# Patient Record
Sex: Female | Born: 1955 | Race: Black or African American | Hispanic: No | Marital: Married | Smoking: Never smoker
Health system: Southern US, Community
[De-identification: ages and names within clinical notes are randomized; demographics above are authoritative.]

## PROBLEM LIST (undated history)

## (undated) DIAGNOSIS — Z1231 Encounter for screening mammogram for malignant neoplasm of breast: Secondary | ICD-10-CM

## (undated) DIAGNOSIS — Z01419 Encounter for gynecological examination (general) (routine) without abnormal findings: Secondary | ICD-10-CM

## (undated) DIAGNOSIS — D649 Anemia, unspecified: Secondary | ICD-10-CM

## (undated) DIAGNOSIS — K5792 Diverticulitis of intestine, part unspecified, without perforation or abscess without bleeding: Secondary | ICD-10-CM

## (undated) DIAGNOSIS — E559 Vitamin D deficiency, unspecified: Secondary | ICD-10-CM

## (undated) HISTORY — PX: COLONOSCOPY: SHX174

---

## 1979-12-14 HISTORY — PX: BREAST CYST ASPIRATION: SHX578

## 2011-08-25 NOTE — Progress Notes (Signed)
HISTORY OF PRESENT ILLNESS  SCARLETH BRAME is a 55 y.o. female.  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  Negative for fever, chills, weight loss, malaise/fatigue and diaphoresis.   HENT: Negative for nosebleeds, congestion, neck pain and tinnitus.    Eyes: Negative for blurred vision, double vision and photophobia.   Respiratory: Negative for cough, hemoptysis, sputum production, shortness of breath and wheezing.    Cardiovascular: Negative for chest pain, palpitations, orthopnea, claudication, leg swelling and PND.   Gastrointestinal: Negative for heartburn, nausea, vomiting, abdominal pain, diarrhea, constipation, blood in stool and melena.   Genitourinary: Negative for dysuria, urgency, frequency and hematuria.   Musculoskeletal: Negative for myalgias, back pain and joint pain.   Skin: Negative for itching and rash.   Neurological: Negative for dizziness, tingling, sensory change, speech change, focal weakness, weakness and headaches.   Endo/Heme/Allergies: Negative for polydipsia. Does not bruise/bleed easily.   Psychiatric/Behavioral: Negative for depression. The patient is not nervous/anxious and does not have insomnia.        Physical Exam   Nursing note and vitals reviewed.  Constitutional: She is oriented to person, place, and time. She appears well-developed and well-nourished.   HENT:   Head: Normocephalic and atraumatic.   Right Ear: External ear normal.   Left Ear: External ear normal.   Nose: Nose normal.   Mouth/Throat: Oropharynx is clear and moist.   Neck: Normal range of motion. Neck supple. No JVD present. Carotid bruit is not present. No mass and no thyromegaly present.    Cardiovascular: Normal rate, regular rhythm, S1 normal, S2 normal, normal heart sounds, intact distal pulses and normal pulses.  Exam reveals no gallop and no friction rub.    No murmur heard.  Pulmonary/Chest: Effort normal and breath sounds normal.   Abdominal: Soft. Bowel sounds are normal.   Musculoskeletal: Normal range of motion.   Neurological: She is alert and oriented to person, place, and time. She has normal strength.   Skin: Skin is warm and dry.   Psychiatric: She has a normal mood and affect. Her behavior is normal. Judgment and thought content normal.       ASSESSMENT and PLAN  Janeece was seen today for establish care.    Diagnoses and associated orders for this visit:    Uti (lower urinary tract infection)- this has resolved    Routine general medical examination at a health care facility    Visit for screening mammogram  - MAM MAMMO BI SCREENING DIGTL; Future    Lipid disorder- she takes some fish oil, flax seed and zinc    Anemia- this is better with iron        lab results and schedule of future lab studies reviewed with patient  reviewed diet, exercise and weight control  cardiovascular risk and specific lipid/LDL goals reviewed  reviewed medications and side effects in detail

## 2011-10-08 ENCOUNTER — Encounter

## 2011-10-13 NOTE — Progress Notes (Signed)
Quick Note:    Letter sent  ______

## 2012-03-29 ENCOUNTER — Other Ambulatory Visit

## 2012-03-29 LAB — AMB POC URINALYSIS DIP STICK AUTO W/O MICRO
Glucose (UA POC): NEGATIVE
Ketones (UA POC): NEGATIVE
Leukocyte esterase (UA POC): NEGATIVE
Nitrites (UA POC): NEGATIVE
Protein (UA POC): NEGATIVE mg/dL
Specific gravity (UA POC): 1.025 (ref 1.001–1.035)
pH (UA POC): 5.5 (ref 4.6–8.0)

## 2012-03-29 NOTE — Progress Notes (Signed)
Obtained appt for CT today at 1:30 arrival and 3:30 procedure.

## 2012-03-29 NOTE — Progress Notes (Signed)
HISTORY OF PRESENT ILLNESS  Melanie Hudson is a 56 y.o. female.  HPI  She had a pina on her left side and she went to [facility]- told her it was a UTI- started getting ready for the trip and had the pain in her side again- she had the pain in December and the pain came again in [place]- states that having a bowel movement does make it better - no change blood is stool- normal bowel movements and she does eat a lot of fiber     Review of Systems   Constitutional: Negative for fever, chills, weight loss, malaise/fatigue and diaphoresis.   HENT: Negative for hearing loss, ear pain, congestion, sore throat, neck pain and tinnitus.    Eyes: Negative for blurred vision and double vision.   Respiratory: Negative for cough, hemoptysis, sputum production, shortness of breath and wheezing.    Cardiovascular: Negative for chest pain, palpitations, orthopnea, claudication, leg swelling and PND.   Gastrointestinal: Negative for heartburn, nausea, vomiting, abdominal pain, diarrhea, constipation and blood in stool.   Genitourinary: Negative for dysuria, urgency, frequency, hematuria and flank pain.   Musculoskeletal: Negative for myalgias, back pain and joint pain.   Skin: Negative.    Neurological: Negative for dizziness, tingling, sensory change, speech change, focal weakness, seizures, loss of consciousness, weakness and headaches.   Endo/Heme/Allergies: Negative for environmental allergies and polydipsia. Does not bruise/bleed easily.   Psychiatric/Behavioral: Negative for depression, suicidal ideas, memory loss and substance abuse. The patient is not nervous/anxious and does not have insomnia.        Physical Exam   Nursing note and vitals reviewed.  Constitutional: She is oriented to person, place, and time. She appears well-developed and well-nourished.   HENT:   Head: Normocephalic and atraumatic.   Right Ear: External ear normal.   Left Ear: External ear normal.   Nose: Nose normal.   Mouth/Throat: Oropharynx is  clear and moist.   Eyes: Conjunctivae and EOM are normal. Pupils are equal, round, and reactive to light.   Neck: Normal range of motion. Neck supple.   Cardiovascular: Normal rate, regular rhythm, normal heart sounds and intact distal pulses.    Pulmonary/Chest: Effort normal and breath sounds normal. Right breast exhibits no inverted nipple, no mass, no nipple discharge, no skin change and no tenderness. Left breast exhibits no inverted nipple, no mass, no nipple discharge, no skin change and no tenderness. Breasts are symmetrical.   Abdominal: Soft. Bowel sounds are normal. There is tenderness.        Left lower abdominal pain   Genitourinary: Rectum normal and vagina normal. Rectal exam shows anal tone normal. Guaiac negative stool. No breast swelling, tenderness, discharge or bleeding.        No CMT some mild pressure on right  Adnexal check   Musculoskeletal: Normal range of motion.   Neurological: She is alert and oriented to person, place, and time.   Skin: Skin is warm and dry.   Psychiatric: She has a normal mood and affect. Her behavior is normal. Judgment and thought content normal.       ASSESSMENT and PLAN  Melanie Hudson was seen today for well woman.    Diagnoses and associated orders for this visit:    Routine general medical examination at a health care facility  - LIPID PANEL    Urinary pain  - AMB POC URINALYSIS DIP STICK AUTO W/O MICRO    Abdominal pain, unspecified site- ? Ovarian cyst or diverticulitis? Is  it something with the colon? SHe needs a CT scan and we need to set her up with a colonoscopy  - CT ABD W CONT; Future  - CT PELV W CONT; Future  - METABOLIC PANEL, COMPREHENSIVE  - CBC WITH AUTOMATED DIFF  - TSH, 3RD GENERATION        lab results and schedule of future lab studies reviewed with patient  reviewed diet, exercise and weight control  cardiovascular risk and specific lipid/LDL goals reviewed  reviewed medications and side effects in detail  Subjective:   57 y.o. female for annual  routine Pap and checkup.  She is postmenopausal.  Social History: single partner, contraception - post menopausal status.  Pertinent past medical hstory: no history of HTN, DVT, CAD, DM, liver disease, migraines or smoking.      ROS:  Feeling well. No dyspnea or chest pain on exertion.  No abdominal pain, change in bowel habits, black or bloody stools.  No urinary tract symptoms. GYN ROS: no breast pain or new or enlarging lumps on self exam, no vaginal bleeding, no discharge or pelvic pain. Menopausal symptoms: none. No neurological complaints.        Objective:   BP 136/68   Temp(Src) 98.1 ??F (36.7 ??C) (Oral)   Resp 14   Ht 5' 5.5" (1.664 m)   Wt 182 lb (82.555 kg)   BMI 29.83 kg/m2  The patient appears well, alert, oriented x 3, in no distress.  ENT normal.  Neck supple. No adenopathy or thyromegaly. PERLA. Lungs are clear, good air entry, no wheezes, rhonchi or rales. S1 and S2 normal, no murmurs, regular rate and rhythm. Abdomen soft without tenderness, guarding, mass or organomegaly. Extremities show no edema, normal peripheral pulses. Neurological is normal, no focal findings.    BREAST EXAM: breasts appear normal, no suspicious masses, no skin or nipple changes or axillary nodes    PELVIC EXAM: normal external genitalia, vulva, vagina, cervix, uterus and adnexa    Assessment/Plan:   well woman  postmenopausal  mammogram  pap smear  return annually or prn  screening colonoscopy referral written  Melanie Hudson was seen today for well woman.    Diagnoses and associated orders for this visit:    Routine general medical examination at a health care facility  - LIPID PANEL    Urinary pain  - AMB POC URINALYSIS DIP STICK AUTO W/O MICRO    Abdominal pain, unspecified site  - CT ABD W CONT; Future  - CT PELV W CONT; Future  - METABOLIC PANEL, COMPREHENSIVE  - CBC WITH AUTOMATED DIFF  - TSH, 3RD GENERATION        lab results and schedule of future lab studies reviewed with patient  reviewed diet, exercise and weight control   cardiovascular risk and specific lipid/LDL goals reviewed  reviewed medications and side effects in detail.

## 2012-03-29 NOTE — Progress Notes (Signed)
"  REVIEWED RECORD IN PREPARATION FOR VISIT AND HAVE OBTAINED NECESSARY DOCUMENTATION"

## 2012-03-29 NOTE — Addendum Note (Signed)
Addended by: Titus Dubin on: 03/29/2012 01:38 PM     Modules accepted: Orders

## 2012-03-29 NOTE — Addendum Note (Signed)
Addended by: Delynn Flavin on: 03/29/2012 04:38 PM     Modules accepted: Orders

## 2012-03-30 ENCOUNTER — Encounter

## 2012-03-30 LAB — CBC WITH AUTOMATED DIFF
ABS. BASOPHILS: 0 10*3/uL (ref 0.0–0.2)
ABS. EOSINOPHILS: 0.2 10*3/uL (ref 0.0–0.4)
ABS. IMM. GRANS.: 0 10*3/uL (ref 0.0–0.1)
ABS. MONOCYTES: 0.4 10*3/uL (ref 0.1–1.0)
ABS. NEUTROPHILS: 3 10*3/uL (ref 1.8–7.8)
Abs Lymphocytes: 1.7 10*3/uL (ref 0.7–4.5)
BASOPHILS: 0 % (ref 0–3)
EOSINOPHILS: 3 % (ref 0–7)
HCT: 34.9 % (ref 34.0–46.6)
HGB: 11.9 g/dL (ref 11.1–15.9)
IMMATURE GRANULOCYTES: 0 % (ref 0–2)
Lymphocytes: 32 % (ref 14–46)
MCH: 31.5 pg (ref 26.6–33.0)
MCHC: 34.1 g/dL (ref 31.5–35.7)
MCV: 92 fL (ref 79–97)
MONOCYTES: 7 % (ref 4–13)
NEUTROPHILS: 58 % (ref 40–74)
PLATELET: 206 10*3/uL (ref 140–415)
RBC: 3.78 x10E6/uL (ref 3.77–5.28)
RDW: 13.7 % (ref 12.3–15.4)
WBC: 5.3 10*3/uL (ref 4.0–10.5)

## 2012-03-30 LAB — METABOLIC PANEL, COMPREHENSIVE
A-G Ratio: 1.5 (ref 1.1–2.5)
ALT (SGPT): 26 IU/L (ref 0–32)
AST (SGOT): 23 IU/L (ref 0–40)
Albumin: 4.4 g/dL (ref 3.5–5.5)
Alk. phosphatase: 88 IU/L (ref 25–150)
BUN/Creatinine ratio: 19 (ref 9–23)
BUN: 15 mg/dL (ref 6–24)
Bilirubin, total: 0.3 mg/dL (ref 0.0–1.2)
CO2: 22 mmol/L (ref 20–32)
Calcium: 9.3 mg/dL (ref 8.7–10.2)
Chloride: 103 mmol/L (ref 97–108)
Creatinine: 0.79 mg/dL (ref 0.57–1.00)
GFR est non-AA: 83 mL/min/{1.73_m2} (ref >59)
GLOBULIN, TOTAL: 2.9 g/dL (ref 1.5–4.5)
Glucose: 83 mg/dL (ref 65–99)
Potassium: 4.2 mmol/L (ref 3.5–5.2)
Protein, total: 7.3 g/dL (ref 6.0–8.5)
Sodium: 141 mmol/L (ref 134–144)
eGFR If African American: 96 mL/min/{1.73_m2} (ref >59)

## 2012-03-30 LAB — LIPID PANEL
Cholesterol, total: 189 mg/dL (ref 100–199)
HDL Cholesterol: 52 mg/dL (ref >39)
LDL, calculated: 117 mg/dL — ABNORMAL HIGH (ref 0–99)
Triglyceride: 99 mg/dL (ref 0–149)
VLDL, calculated: 20 mg/dL (ref 5–40)

## 2012-03-30 LAB — TSH 3RD GENERATION: TSH: 1.58 u[IU]/mL (ref 0.450–4.500)

## 2012-03-30 MED ORDER — IOVERSOL 350 MG/ML IV SOLN
350 mg iodine/mL | Freq: Once | INTRAVENOUS | Status: AC
Start: 2012-03-30 — End: 2012-03-30
  Administered 2012-03-30: 16:00:00 via INTRAVENOUS

## 2012-03-30 MED ORDER — IOVERSOL 350 MG/ML IV SOLN
350 mg iodine/mL | Freq: Once | INTRAVENOUS | Status: AC
Start: 2012-03-30 — End: 2012-03-30

## 2012-03-30 MED ORDER — CIPROFLOXACIN 500 MG TAB
500 mg | ORAL_TABLET | Freq: Two times a day (BID) | ORAL | Status: AC
Start: 2012-03-30 — End: 2012-04-09

## 2012-03-30 MED ORDER — METRONIDAZOLE 500 MG TAB
500 mg | ORAL_TABLET | Freq: Two times a day (BID) | ORAL | Status: AC
Start: 2012-03-30 — End: 2012-04-09

## 2012-03-30 MED FILL — OPTIRAY 350 MG IODINE/ML INTRAVENOUS SOLUTION: 350 mg iodine/mL | INTRAVENOUS | Qty: 100

## 2012-03-30 NOTE — Progress Notes (Signed)
Quick Note:    Sent in antibiotics - follow up in a week  ______

## 2012-03-30 NOTE — Progress Notes (Signed)
Quick Note:    Letter sent  ______

## 2012-03-31 LAB — CULTURE, URINE

## 2012-04-12 NOTE — Progress Notes (Signed)
"  Reviewed record in preparation for visit and have obtained necessary documentation."

## 2012-04-12 NOTE — Progress Notes (Signed)
HISTORY OF PRESENT ILLNESS  Melanie Hudson is a 56 y.o. female.  HPI  Diverticulitis- she did feel better after she took the antibiotics- she knows that she is suppose to have fiber in her diet- she knows that it is the travel that makes it worse- she is trying to eat right and no longer has any abdominal pain  Review of Systems   Constitutional: Negative.  Negative for fever, chills, weight loss, malaise/fatigue and diaphoresis.   HENT: Negative for nosebleeds, congestion, neck pain and tinnitus.    Eyes: Negative for blurred vision, double vision and photophobia.   Respiratory: Negative for cough, hemoptysis, sputum production, shortness of breath and wheezing.    Cardiovascular: Negative for chest pain, palpitations, orthopnea, claudication, leg swelling and PND.   Gastrointestinal: Negative for heartburn, nausea, vomiting, abdominal pain, diarrhea, constipation, blood in stool and melena.   Genitourinary: Negative for dysuria, urgency, frequency and hematuria.   Musculoskeletal: Negative for myalgias, back pain and joint pain.   Skin: Negative for itching and rash.   Neurological: Negative for dizziness, tingling, sensory change, speech change, focal weakness, weakness and headaches.   Endo/Heme/Allergies: Negative for polydipsia. Does not bruise/bleed easily.   Psychiatric/Behavioral: Negative for depression. The patient is not nervous/anxious and does not have insomnia.        Physical Exam   Nursing note and vitals reviewed.  Constitutional: She is oriented to person, place, and time. She appears well-developed and well-nourished.   HENT:   Head: Normocephalic and atraumatic.   Right Ear: External ear normal.   Left Ear: External ear normal.   Nose: Nose normal.   Mouth/Throat: Oropharynx is clear and moist.   Neck: Normal range of motion. Neck supple. No JVD present. Carotid bruit is not present. No mass and no thyromegaly present.   Cardiovascular: Normal rate, regular rhythm, S1 normal, S2 normal,  normal heart sounds, intact distal pulses and normal pulses.  Exam reveals no gallop and no friction rub.    No murmur heard.  Pulmonary/Chest: Effort normal and breath sounds normal.   Abdominal: Soft. Bowel sounds are normal.   Musculoskeletal: Normal range of motion.   Neurological: She is alert and oriented to person, place, and time. She has normal strength.   Skin: Skin is warm and dry.   Psychiatric: She has a normal mood and affect. Her behavior is normal. Judgment and thought content normal.       ASSESSMENT and PLAN  Melanie Hudson was seen today for follow-up.    Diagnoses and associated orders for this visit:    Diverticulitis  Over 50% of the 25 minutes face to face with Melanie Hudson consisted of counseling and/or discussing treatment plans in reference to her abdominal pain and her bowel movements, diverticulitis.                lab results and schedule of future lab studies reviewed with patient  reviewed diet, exercise and weight control  cardiovascular risk and specific lipid/LDL goals reviewed  reviewed medications and side effects in detail

## 2012-09-13 LAB — AMB POC URINALYSIS DIP STICK AUTO W/O MICRO
Glucose (UA POC): NEGATIVE
Ketones (UA POC): NEGATIVE
Leukocyte esterase (UA POC): NEGATIVE
Nitrites (UA POC): POSITIVE
Protein (UA POC): NEGATIVE mg/dL
Specific gravity (UA POC): 1.025 (ref 1.001–1.035)
pH (UA POC): 6 (ref 4.6–8.0)

## 2012-09-13 NOTE — Progress Notes (Signed)
"  Reviewed record in preparation for visit and have obtained necessary documentation."

## 2012-09-13 NOTE — Progress Notes (Signed)
HISTORY OF PRESENT ILLNESS  Melanie Hudson is a 56 y.o. female.  HPI  Subjective:     Melanie Hudson is a 56 y.o. female who complains of dysuria, frequency, urgency, hematuria, hesitancy for 7 days.  Patient also complains of back pain. Patient denies fever.  Patient does have a history of recurrent UTI.  Patient does not have a history of pyelonephritis. She doe shave some back pain and feels like it is internal    Review of Systems   Constitutional: Negative.  Negative for fever, chills, weight loss, malaise/fatigue and diaphoresis.   HENT: Negative for nosebleeds, congestion, neck pain and tinnitus.    Eyes: Negative for blurred vision, double vision and photophobia.   Respiratory: Negative for cough, hemoptysis, sputum production, shortness of breath and wheezing.    Cardiovascular: Negative for chest pain, palpitations, orthopnea, claudication, leg swelling and PND.   Gastrointestinal: Negative for heartburn, nausea, vomiting, abdominal pain, diarrhea, constipation, blood in stool and melena.   Genitourinary: Positive for dysuria, urgency and frequency. Negative for hematuria.   Musculoskeletal: Negative for myalgias, back pain and joint pain.   Skin: Negative for itching and rash.   Neurological: Negative for dizziness, tingling, sensory change, speech change, focal weakness, weakness and headaches.   Endo/Heme/Allergies: Negative for polydipsia. Does not bruise/bleed easily.   Psychiatric/Behavioral: Negative for depression. The patient is not nervous/anxious and does not have insomnia.        Physical Exam   Nursing note and vitals reviewed.  Constitutional: She is oriented to person, place, and time. She appears well-developed and well-nourished.   HENT:   Head: Normocephalic and atraumatic.   Right Ear: External ear normal.   Left Ear: External ear normal.   Nose: Nose normal.   Mouth/Throat: Oropharynx is clear and moist.   Neck: Normal range of motion. Neck supple. No JVD present. Carotid  bruit is not present. No mass and no thyromegaly present.   Cardiovascular: Normal rate, regular rhythm, S1 normal, S2 normal, normal heart sounds, intact distal pulses and normal pulses.  Exam reveals no gallop and no friction rub.    No murmur heard.  Pulmonary/Chest: Effort normal and breath sounds normal.   Abdominal: Soft. Bowel sounds are normal.   Musculoskeletal: Normal range of motion.   Neurological: She is alert and oriented to person, place, and time. She has normal strength.   Skin: Skin is warm and dry.   Psychiatric: She has a normal mood and affect. Her behavior is normal. Judgment and thought content normal.       ASSESSMENT and PLAN  Melanie Hudson was seen today for back pain and urinary frequency.    Diagnoses and associated orders for this visit:    Frequency of urination  - AMB POC URINALYSIS DIP STICK AUTO W/O MICRO  - CULTURE, URINE    Back pain-resolving and doing better    Other Orders  - ciprofloxacin (CIPRO) 500 mg tablet; Take 1 Tab by mouth two (2) times a day for 7 days.        lab results and schedule of future lab studies reviewed with patient  reviewed diet, exercise and weight control  cardiovascular risk and specific lipid/LDL goals reviewed  reviewed medications and side effects in detail

## 2012-09-15 LAB — CULTURE, URINE

## 2012-09-18 NOTE — Telephone Encounter (Signed)
Message copied by Littie Deeds on Mon Sep 18, 2012 10:09 AM  ------       Message from: [place], [place]ON[place]       Created: Mon Sep 18, 2012  6:58 AM         Please call she needs to be on different antibiotic- I sent in bactrim for her and she needs to stop the cipro as that antibiotic will not work for her urine

## 2012-09-18 NOTE — Telephone Encounter (Signed)
Called pt and left a v/m for pt to stop taking Cipro per PCP and start taking Bactrim and it was sent to her local pharmacy.

## 2012-09-18 NOTE — Progress Notes (Signed)
Quick Note:    Please call she needs to be on different antibiotic- I sent in bactrim for her and she needs to stop the cipro as that antibiotic will not work for her urine  ______

## 2012-09-22 NOTE — Telephone Encounter (Signed)
Patient return my phone call from 09/18/2012 stating that she just check her phone and got my message stating that she needs to stop taking the Cipro and start taking Bactrim. Pt stated that she completed the Cipro and will start taking the Bactrim today. Pt denies any nausea, vomiting or diarrhea. Advised pt to make a follow up appt with PCP so that she can get her urine tested again to see if she is still having bacteria in her urine. Pt understood and will make a follow up appt with PCP.

## 2012-10-17 ENCOUNTER — Encounter

## 2012-10-25 LAB — AMB POC URINALYSIS DIP STICK AUTO W/O MICRO
Glucose (UA POC): NEGATIVE
Leukocyte esterase (UA POC): NEGATIVE
Nitrites (UA POC): NEGATIVE
Protein (UA POC): NEGATIVE mg/dL
Specific gravity (UA POC): 1.02 (ref 1.001–1.035)
pH (UA POC): 7.5 (ref 4.6–8.0)

## 2012-10-25 NOTE — Progress Notes (Signed)
"  Reviewed record in preparation for visit and have obtained necessary documentation."

## 2012-10-25 NOTE — Progress Notes (Signed)
HISTORY OF PRESENT ILLNESS  Melanie Hudson is a 56 y.o. female.  HPI  Has had back pain with some blood in urine and here for follow up- urgency and frequency is all better now and feels better   She is doing her treadmill and walks for an hour  Review of Systems   Constitutional: Negative.  Negative for fever, chills, weight loss, malaise/fatigue and diaphoresis.   HENT: Negative for nosebleeds, congestion, neck pain and tinnitus.    Eyes: Negative for blurred vision, double vision and photophobia.   Respiratory: Negative for cough, hemoptysis, sputum production, shortness of breath and wheezing.    Cardiovascular: Negative for chest pain, palpitations, orthopnea, claudication, leg swelling and PND.   Gastrointestinal: Negative for heartburn, nausea, vomiting, abdominal pain, diarrhea, constipation, blood in stool and melena.   Genitourinary: Negative for dysuria, urgency, frequency and hematuria.   Musculoskeletal: Negative for myalgias, back pain and joint pain.   Skin: Negative for itching and rash.   Neurological: Negative for dizziness, tingling, sensory change, speech change, focal weakness, weakness and headaches.   Endo/Heme/Allergies: Negative for polydipsia. Does not bruise/bleed easily.   Psychiatric/Behavioral: Negative for depression. The patient is not nervous/anxious and does not have insomnia.        Physical Exam   Nursing note and vitals reviewed.  Constitutional: She is oriented to person, place, and time. She appears well-developed and well-nourished.   HENT:   Head: Normocephalic and atraumatic.   Right Ear: External ear normal.   Left Ear: External ear normal.   Nose: Nose normal.   Mouth/Throat: Oropharynx is clear and moist.   Neck: Normal range of motion. Neck supple. No JVD present. Carotid bruit is not present. No mass and no thyromegaly present.   Cardiovascular: Normal rate, regular rhythm, S1 normal, S2 normal, normal heart sounds, intact distal pulses and normal pulses.  Exam  reveals no gallop and no friction rub.    No murmur heard.  Pulmonary/Chest: Effort normal and breath sounds normal.   Abdominal: Soft. Bowel sounds are normal.   Musculoskeletal: Normal range of motion.   Neurological: She is alert and oriented to person, place, and time. She has normal strength.   Skin: Skin is warm and dry.   Psychiatric: She has a normal mood and affect. Her behavior is normal. Judgment and thought content normal.       ASSESSMENT and PLAN  Bradi was seen today for follow-up.    Diagnoses and associated orders for this visit:    Back pain- urine looks better but will send off for culture - but will check [place] given back pain and hematuria  - [place] ABD COMP; Future    Screening for colon cancer  - REFERRAL TO GASTROENTEROLOGY        lab results and schedule of future lab studies reviewed with patient  reviewed diet, exercise and weight control  cardiovascular risk and specific lipid/LDL goals reviewed  reviewed medications and side effects in detail

## 2012-10-25 NOTE — Addendum Note (Signed)
Addended by: Eugene Garnet A on: 10/25/2012 03:38 PM     Modules accepted: Orders

## 2012-10-27 LAB — CULTURE, URINE
Urine Culture, Routine: NO GROWTH
Urine Culture, Routine: NO GROWTH

## 2012-10-31 NOTE — Progress Notes (Signed)
Quick Note:    Ultrasound normal  ______

## 2012-11-01 NOTE — Telephone Encounter (Signed)
Message copied by Brent General on Wed Nov 01, 2012  1:42 PM  ------       Message from: [facility], [place]       Created: Tue Oct 31, 2012  3:22 PM         Ultrasound normal  ------

## 2012-11-02 NOTE — Telephone Encounter (Signed)
Advised pt of [place] results. Pt has scheduled her colonoscopy for December.

## 2013-03-28 NOTE — Telephone Encounter (Signed)
Message copied by Hazeline Junker on Wed Mar 28, 2013  8:31 AM  ------       Message from: GAMBLE, Geoffry Paradise       Created: Tue Mar 27, 2013  5:31 PM       Regarding: Sherryll Burger / Telephone         [phone]              Pt is faxing an employment health form to the office and wants to know if it can be completed and returned to her.   ------

## 2013-04-17 LAB — AMB POC URINALYSIS DIP STICK AUTO W/O MICRO
Glucose (UA POC): NEGATIVE
Ketones (UA POC): NEGATIVE
Leukocyte esterase (UA POC): NEGATIVE
Nitrites (UA POC): POSITIVE
Protein (UA POC): NEGATIVE mg/dL
Specific gravity (UA POC): 1.025 (ref 1.001–1.035)
pH (UA POC): 6 (ref 4.6–8.0)

## 2013-04-17 NOTE — Patient Instructions (Signed)
Elevated Blood Pressure: After Your Visit  Your Care Instructions  Blood pressure is a measure of how hard the blood pushes against the walls of your arteries. It's normal for blood pressure to go up and down throughout the day. But if it stays up over time, you have high blood pressure.  Two numbers tell you your blood pressure. The first number is the systolic pressure. It shows how hard the blood pushes when your heart is pumping. The second number is the diastolic pressure. It shows how hard the blood pushes between heartbeats, when your heart is relaxed and filling with blood. A normal blood pressure in adults is less than 120/80 (say "120 over 80"). High blood pressure is 140/90 or higher.  The main test for high blood pressure is simple, fast, and painless. To diagnose high blood pressure, your doctor will test your blood pressure at different times. You may have to check your blood pressure at home if there is reason to think that the results in the doctor's office aren't accurate.  If you are diagnosed with high blood pressure, you can work with your doctor to make a long-term plan to manage it.  Follow-up care is a key part of your treatment and safety. Be sure to make and go to all appointments, and call your doctor if you are having problems. It's also a good idea to know your test results and keep a list of the medicines you take.  How can you care for yourself at home?  ?? Do not smoke. Smoking increases your risk for heart attack and stroke. If you need help quitting, talk to your doctor about stop-smoking programs and medicines. These can increase your chances of quitting for good.  ?? Stay at a healthy weight.  ?? Limit sodium.  ?? Be physically active. Get at least 30 minutes of exercise on most days of the week. Walking is a good choice. You also may want to do other activities, such as running, swimming, cycling, or playing tennis or team sports.  ?? Avoid or limit alcohol. Talk to your doctor about  whether you can drink any alcohol.  ?? Eat plenty of fruits, vegetables, and low-fat dairy products. Eat less saturated and total fats.  ?? Learn how to check your blood pressure at home.  When should you call for help?  Call your doctor now or seek immediate medical care if:  ?? Your blood pressure is much higher than normal (such as 180/110 or higher).  ?? You think high blood pressure is causing symptoms such as:  ?? Severe headache.  ?? Blurry vision.  ?? Nausea or vomiting.  Watch closely for changes in your health, and be sure to contact your doctor if:  ?? You do not get better as expected.   Where can you learn more?   Go to http://www.healthwise.net/[facility]  Enter F644 in the search box to learn more about "Elevated Blood Pressure: After Your Visit."   ?? 2006-2014 [employer]. Care instructions adapted under license by [employer] (which disclaims liability or warranty for this information). This care instruction is for use with your licensed healthcare professional. If you have questions about a medical condition or this instruction, always ask your healthcare professional. Healthwise, Incorporated disclaims any warranty or liability for your use of this information.  Content Version: 10.0.270728; Last Revised: April 07, 2011              High Blood Pressure: After Your Visit  Your   Care Instructions  If your blood pressure is usually above 140/90, you have high blood pressure, or hypertension. Despite what a lot of people think, high blood pressure usually doesn't cause headaches or make you feel dizzy or lightheaded. It usually has no symptoms. But it does increase your risk for heart attack, stroke, and kidney or eye damage. The higher your blood pressure, the more your risk increases.  Your doctor will give you a goal for your blood pressure. This goal may be below 140/90. Or it may be even lower if you have other health problems, such as diabetes, heart failure, or coronary artery disease.   Changes in your lifestyle, such as staying at a healthy weight, may help you lower your blood pressure. Your treatment also will include medicine. If you stop taking your medicine, your blood pressure will go back up.  Follow-up care is a key part of your treatment and safety. Be sure to make and go to all appointments, and call your doctor if you are having problems. It???s also a good idea to know your test results and keep a list of the medicines you take.  How can you care for yourself at home?  Medical treatment  ?? Take your medicine exactly as prescribed. You may take one or more types of medicine to lower your blood pressure. They include diuretics, beta-blockers, ACE inhibitors, calcium channel blockers, angiotensin II receptor blockers, and other medicines. Call your doctor if you think you are having a problem with your medicine.  ?? Your doctor may suggest that you take one low-dose aspirin (81 mg) a day. This can help reduce your risk of having a stroke or heart attack.  ?? See your doctor at least 2 times a year. You may need to see the doctor more often at first or until your blood pressure comes down.  ?? If you are taking blood pressure medicine, talk to your doctor before you take decongestants or anti-inflammatory medicine, such as ibuprofen. Some of these medicines can raise blood pressure.  ?? Learn how to check your blood pressure at home.  Lifestyle changes  ?? Stay at a healthy weight. This is especially important if you put on weight around the waist. Losing even 10 pounds can help you lower your blood pressure.  ?? If your doctor recommends it, get more exercise. Walking is a good choice. Bit by bit, increase the amount you walk every day. Try for at least 30 minutes on most days of the week. You also may want to swim, bike, or do other activities.  ?? Avoid or limit alcohol. Talk to your doctor about whether you can drink any alcohol.  ?? Limit salt.  ?? Eat plenty of fruits (such as bananas and  oranges), vegetables, legumes, whole grains, and low-fat dairy products.  ?? Lower the amount of saturated fat in your diet. Saturated fat is found in animal products such as milk, cheese, and meat. Limiting these foods may help you lose weight and also lower your risk for heart disease.  ?? Do not smoke. Smoking increases your risk for heart attack and stroke. If you need help quitting, talk to your doctor about stop-smoking programs and medicines. These can increase your chances of quitting for good.  When should you call for help?  Call your doctor now or seek immediate medical care if:  ?? Your blood pressure is much higher than normal (such as 180/110 or higher).  ?? You think high blood pressure is   causing symptoms such as:  ?? Severe headache.  ?? Blurry vision.  ?? Nausea or vomiting.  Watch closely for changes in your health, and be sure to contact your doctor if:  ?? You do not get better as expected.   Where can you learn more?   Go to http://www.healthwise.net/[facility]  Enter X567 in the search box to learn more about "High Blood Pressure: After Your Visit."   ?? 2006-2014 Healthwise, Inc[employer]. Care instructions adapted under license by [employer] (which disclaims liability or warranty for this information). This care instruction is for use with your licensed healthcare professional. If you have questions about a medical condition or this instruction, always ask your healthcare professional. Healthwise, Incorporated disclaims any warranty or liability for your use of this information.  Content Version: 10.0.270728; Last Revised: February 01, 2012

## 2013-04-17 NOTE — Progress Notes (Signed)
Subjective:     Chief Complaint   Patient presents with   ??? Urinary Burning   ??? Urinary Frequency     Melanie Hudson is a 57 y.o. female who complains of dysuria, frequency, urgency for 1 month. Patient denies flank pain, vomiting, fever, unusual vaginal discharge. Patient does have a history of recurrent UTI.  Patient does not have a history of pyelonephritis.    Blood pressure without hypertension  No cp/sob  She does not check her bp at home but has bp cuff    Current Outpatient Prescriptions   Medication Sig Dispense Refill   ??? trimethoprim-sulfamethoxazole (BACTRIM DS, SEPTRA DS) 160-800 mg per tablet Take 1 Tab by mouth two (2) times a day for 10 days.  20 Tab  0     Past Medical History   Diagnosis Date   ??? Anemia 08/25/2011     Past Surgical History   Procedure Laterality Date   ??? Endoscopy, colon, diagnostic       2005 normal        Review of Systems  Genitourinary:positive for frequency, dysuria and hematuria    Objective:     BP 166/88   Pulse 67   Temp(Src) 98.1 ??F (36.7 ??C) (Oral)   Resp 20   Ht 5' 5.5" (1.664 m)   Wt 186 lb (84.369 kg)   BMI 30.47 kg/m2   SpO2 98%  General Appearance:  Well developed, well nourished,alert and oriented x 3, and individual in no acute distress.   Ears/Nose/Mouth/Throat:   Hearing grossly normal.         Neck: Supple.   Chest:   Lungs clear to auscultation bilaterally.   Cardiovascular:  Regular rate and rhythm, S1, S2 normal, no murmur.   Abdomen:   Soft, non-tender, bowel sounds are active.   Extremities: No edema bilaterally.    Skin: Warm and dry.               Assessment/Plan:     Leetta was seen today for urinary burning and urinary frequency.    Diagnoses and associated orders for this visit:    UTI (lower urinary tract infection)  Hx of resistence to cipro  - trimethoprim-sulfamethoxazole (BACTRIM DS, SEPTRA DS) 160-800 mg per tablet; Take 1 Tab by mouth two (2) times a day for 10 days.  ucx pending  - AMB POC URINALYSIS DIP STICK AUTO W/O MICRO  - CULTURE,  URINE  - URINALYSIS W/ RFLX MICROSCOPIC    Blood pressure elevated without history of HTN  Reviewed prior bp with pt in [facility]  She will check bp readings at home and if elevated will rtc   Info given re: dash diet

## 2013-04-19 LAB — CULTURE, URINE

## 2013-04-19 LAB — URINALYSIS W/ RFLX MICROSCOPIC
Bilirubin: NEGATIVE
Glucose: NEGATIVE
Ketone: NEGATIVE
Leukocyte Esterase: NEGATIVE
Nitrites: POSITIVE — AB
Protein: NEGATIVE
Specific Gravity: 1.024 (ref 1.005–1.030)
Urobilinogen: 1 mg/dL (ref 0.0–1.9)
pH (UA): 6 (ref 5.0–7.5)

## 2013-04-19 LAB — MICROSCOPIC EXAMINATION

## 2013-04-25 NOTE — Telephone Encounter (Signed)
Patient called and wanted to know the status of her [facility] Staff Health Report that was to be filled out and faxed back to [phone]. The contact number is [phone].

## 2013-04-26 NOTE — Telephone Encounter (Signed)
Made copy and faxed today, will mail back to her.

## 2013-04-26 NOTE — Telephone Encounter (Signed)
Called patient, she sent it via fax. She will try to resend, we have not received the form.

## 2013-05-01 LAB — AMB POC URINALYSIS DIP STICK AUTO W/O MICRO
Glucose (UA POC): NEGATIVE
Ketones (UA POC): NEGATIVE
Leukocyte esterase (UA POC): NEGATIVE
Nitrites (UA POC): NEGATIVE
Protein (UA POC): NEGATIVE mg/dL
Specific gravity (UA POC): 1.02 (ref 1.001–1.035)
pH (UA POC): 7 (ref 4.6–8.0)

## 2013-05-01 NOTE — Progress Notes (Signed)
"  Reviewed record in preparation for visit and have obtained necessary documentation."

## 2013-05-01 NOTE — Progress Notes (Signed)
HISTORY OF PRESENT ILLNESS  Melanie Hudson is a 57 y.o. female.  HPI  Here for follow up with recurrent uti and has trace blood in her urine- no fever or chills or back pain  Subjective:   Melanie Hudson is a 57 y.o. female with hypertension.    Hypertension ROS: not taking medications regularly as instructed, no TIA's, no chest pain on exertion, no dyspnea on exertion, no swelling of ankles.   New concerns: it was high las ttime and so she is back here for a recheck.       Review of Systems   Constitutional: Negative.  Negative for fever, chills, weight loss, malaise/fatigue and diaphoresis.   HENT: Negative for nosebleeds, congestion, neck pain and tinnitus.    Eyes: Negative for blurred vision, double vision and photophobia.   Respiratory: Negative for cough, hemoptysis, sputum production, shortness of breath and wheezing.    Cardiovascular: Negative for chest pain, palpitations, orthopnea, claudication, leg swelling and PND.   Gastrointestinal: Negative for heartburn, nausea, vomiting, abdominal pain, diarrhea, constipation, blood in stool and melena.   Genitourinary: Negative for dysuria, urgency, frequency and hematuria.   Musculoskeletal: Negative for myalgias, back pain and joint pain.   Skin: Negative for itching and rash.   Neurological: Negative for dizziness, tingling, sensory change, speech change, focal weakness, weakness and headaches.   Endo/Heme/Allergies: Negative for polydipsia. Does not bruise/bleed easily.   Psychiatric/Behavioral: Negative for depression. The patient is not nervous/anxious and does not have insomnia.        Physical Exam   Nursing note and vitals reviewed.  Constitutional: She is oriented to person, place, and time. She appears well-developed and well-nourished.   HENT:   Head: Normocephalic and atraumatic.   Right Ear: External ear normal.   Left Ear: External ear normal.   Nose: Nose normal.   Mouth/Throat: Oropharynx is clear and moist.   Neck: Normal range of  motion. Neck supple. No JVD present. Carotid bruit is not present. No mass and no thyromegaly present.   Cardiovascular: Normal rate, regular rhythm, S1 normal, S2 normal, normal heart sounds, intact distal pulses and normal pulses.  Exam reveals no gallop and no friction rub.    No murmur heard.  Pulmonary/Chest: Effort normal and breath sounds normal.   Abdominal: Soft. Bowel sounds are normal.   Musculoskeletal: Normal range of motion.   Neurological: She is alert and oriented to person, place, and time. She has normal strength.   Skin: Skin is warm and dry.   Psychiatric: She has a normal mood and affect. Her behavior is normal. Judgment and thought content normal.       ASSESSMENT and PLAN  Rajean was seen today for blood pressure check and follow-up.    Diagnoses and associated orders for this visit:    Frequency of urination  - AMB POC URINALYSIS DIP STICK AUTO W/O MICRO  - REFERRAL TO UROLOGY    Elevated blood pressure reading without diagnosis of hypertension  This is much better and it was high and she was not feeling well and nervous but better        lab results and schedule of future lab studies reviewed with patient  reviewed diet, exercise and weight control  cardiovascular risk and specific lipid/LDL goals reviewed  reviewed medications and side effects in detail

## 2013-05-01 NOTE — Addendum Note (Signed)
Addended by: Eugene Garnet A on: 05/01/2013 03:49 PM     Modules accepted: Orders

## 2013-05-03 LAB — CULTURE, URINE

## 2013-05-14 NOTE — Progress Notes (Signed)
HISTORY OF PRESENT ILLNESS  Melanie Hudson is a 57 y.o. female.  HPI  States that she is having some right sided back pain and thought something was " acting up"; worred about her bowels- no burning when she urinates, her bowels are normal- she did take Nsaids last week and that did help  She had a big cookout on Memorial day with 30 people and started shortly after that and now she feels a little and not as bad as Friday-     Review of Systems   Constitutional: Negative.  Negative for fever, chills, weight loss, malaise/fatigue and diaphoresis.   HENT: Negative for nosebleeds, congestion, neck pain and tinnitus.    Eyes: Negative for blurred vision, double vision and photophobia.   Respiratory: Negative for cough, hemoptysis, sputum production, shortness of breath and wheezing.    Cardiovascular: Negative for chest pain, palpitations, orthopnea, claudication, leg swelling and PND.   Gastrointestinal: Negative for heartburn, nausea, vomiting, abdominal pain, diarrhea, constipation, blood in stool and melena.   Genitourinary: Negative for dysuria, urgency, frequency and hematuria.   Musculoskeletal: Positive for back pain. Negative for myalgias and joint pain.   Skin: Negative for itching and rash.   Neurological: Negative for dizziness, tingling, sensory change, speech change, focal weakness, weakness and headaches.   Endo/Heme/Allergies: Negative for polydipsia. Does not bruise/bleed easily.   Psychiatric/Behavioral: Negative for depression. The patient is not nervous/anxious and does not have insomnia.        Physical Exam   Nursing note and vitals reviewed.  Constitutional: She is oriented to person, place, and time. She appears well-developed and well-nourished.   HENT:   Head: Normocephalic and atraumatic.   Right Ear: External ear normal.   Left Ear: External ear normal.   Nose: Nose normal.   Mouth/Throat: Oropharynx is clear and moist.   Neck: Normal range of motion. Neck supple. No JVD present. Carotid  bruit is not present. No mass and no thyromegaly present.   Cardiovascular: Normal rate, regular rhythm, S1 normal, S2 normal, normal heart sounds, intact distal pulses and normal pulses.  Exam reveals no gallop and no friction rub.    No murmur heard.  Pulmonary/Chest: Effort normal and breath sounds normal.   Abdominal: Soft. Bowel sounds are normal.   Musculoskeletal: Normal range of motion.   Neurological: She is alert and oriented to person, place, and time. She has normal strength.   Skin: Skin is warm and dry.   Psychiatric: She has a normal mood and affect. Her behavior is normal. Judgment and thought content normal.       ASSESSMENT and PLAN  [name] was seen today for back pain.    Diagnoses and associated orders for this visit:    Back pain- I think this is more muscular than anything else- we had an [place] in 10/2012 that was normal- keep an eye on it - colon in June and has urology appt        lab results and schedule of future lab studies reviewed with patient  reviewed diet, exercise and weight control  cardiovascular risk and specific lipid/LDL goals reviewed  reviewed medications and side effects in detail

## 2013-05-14 NOTE — Progress Notes (Signed)
"  Reviewed record in preparation for visit and have obtained necessary documentation."

## 2013-11-05 ENCOUNTER — Encounter

## 2013-11-20 LAB — AMB POC URINALYSIS DIP STICK AUTO W/O MICRO
Blood (UA POC): NEGATIVE
Glucose (UA POC): NEGATIVE
Ketones (UA POC): NEGATIVE
Nitrites (UA POC): NEGATIVE
Protein (UA POC): NEGATIVE mg/dL
Specific gravity (UA POC): 1.015 (ref 1.001–1.035)
pH (UA POC): 8 (ref 4.6–8.0)

## 2013-11-20 NOTE — Patient Instructions (Addendum)
Vaginitis: After Your Visit  Your Care Instructions  Vaginitis is soreness or infection of the vagina. This common problem can cause itching and burning. And it can cause a change in vaginal discharge. Sometimes it can cause pain during sex. Vaginitis may be caused by bacteria, yeast, or other germs. Some infections that cause it are caught from a sexual partner. Bath products, spermicides, and douches can irritate the vagina too.  Some women have this problem during and after menopause. A drop in estrogen levels during this time can cause dryness, soreness, and pain during sex.  Your doctor can give you medicine to treat an infection. And home care may help you feel better. For certain types of infections, your sex partner must be treated too.  Follow-up care is a key part of your treatment and safety. Be sure to make and go to all appointments, and call your doctor if you are having problems. It's also a good idea to know your test results and keep a list of the medicines you take.  How can you care for yourself at home?  ?? If your doctor prescribed antibiotics, take them as directed. Do not stop taking them just because you feel better. You need to take the full course of antibiotics.  ?? Take your medicines exactly as prescribed. Call your doctor if you think you are having a problem with your medicine.  ?? Do not eat or drink anything that has alcohol if you are taking metronidazole (Flagyl).  ?? If you have a yeast infection, use over-the-counter products as your doctor tells you to. Or take medicine your doctor prescribes exactly as directed.  ?? Wash your vaginal area daily with water. You also can use a mild, unscented soap if you want.  ?? Do not use scented bath products. And do not use vaginal sprays or douches.  ?? Put a washcloth soaked in cool water on the area to relieve itching. Or you can take cool baths.  ?? If you have dryness because of menopause, use estrogen cream or pills that your doctor  prescribes.  ?? Ask your doctor about when it is okay to have sex.  ?? Use a personal lubricant before sex if you have dryness. Examples are Astroglide, K-Y Jelly, and Wet Lubricant Gel.  ?? Ask your doctor if your sex partner also needs treatment.  When should you call for help?  Call your doctor now or seek immediate medical care if:  ?? You have a fever and pelvic pain.  Watch closely for changes in your health, and be sure to contact your doctor if:  ?? You have bleeding other than your period.  ?? You do not get better as expected.   Where can you learn more?   Go to [facility]  Enter [phone] in the search box to learn more about "Vaginitis: After Your Visit."   ?? 2006-2014 [employer]. Care instructions adapted under license by [employer] (which disclaims liability or warranty for this information). This care instruction is for use with your licensed healthcare professional. If you have questions about a medical condition or this instruction, always ask your healthcare professional. Health[employer], Incorporated disclaims any warranty or liability for your use of this information.  Content Version: 10.2.346038; Current as of: February 21, 2013              Well Visit, Women 50 to 81: After Your Visit  Your Care Instructions  Physical exams can help you stay healthy. Your doctor  has checked your overall health and may have suggested ways to take good care of yourself. He or she also may have recommended tests. At home, you can help prevent illness with healthy eating, regular exercise, and other steps.  Follow-up care is a key part of your treatment and safety. Be sure to make and go to all appointments, and call your doctor if you are having problems. It's also a good idea to know your test results and keep a list of the medicines you take.  How can you care for yourself at home?  ?? Reach and stay at a healthy weight. This will lower your risk for many problems, such as obesity,  diabetes, heart disease, and high blood pressure.  ?? Get at least 30 minutes of exercise on most days of the week. Walking is a good choice. You also may want to do other activities, such as running, swimming, cycling, or playing tennis or team sports.  ?? Do not smoke. Smoking can make health problems worse. If you need help quitting, talk to your doctor about stop-smoking programs and medicines. These can increase your chances of quitting for good.  ?? Always wear sunscreen on exposed skin. Make sure the sunscreen blocks ultraviolet rays (both UVA and UVB) and has a sun protection factor (SPF) of at least 15. Use it every day, even when it is cloudy. Some doctors may recommend a higher SPF, such as 30.  ?? See a dentist one or two times a year for checkups and to have your teeth cleaned.  ?? Wear a seat belt in the car.  ?? Limit alcohol to 1 drink a day. Too much alcohol can cause health problems.  Follow your doctor's advice about when to have certain tests. These tests can spot problems early.  ?? Cholesterol. Your doctor will tell you how often to have this done based on your age, family history, or other things that can increase your risk for heart attack and stroke.  ?? Blood pressure. You will likely have your blood pressure checked at any visit to your doctor. If you are healthy and have a blood pressure below 120/80 mm Hg, have your blood pressure checked at least every 1 to 2 years. This can be done during a routine doctor visit. If you have slightly higher or high blood pressure, or are at risk for heart disease, your doctor will suggest more frequent tests.  ?? Mammogram. Ask your doctor how often you should have a mammogram, which is an X-ray of your breasts. A mammogram can spot breast cancer before it can be felt and when it is easiest to treat.  ?? Pap test and pelvic exam. Ask your doctor how often you should have a Pap test. You may not need to have a Pap test as often as you used to.  ?? Vision. Have your  eyes checked every year or two or as often as your doctor suggests. Some experts recommend that you have yearly exams for glaucoma and other age-related eye problems starting at age 43.  ?? Hearing. Tell your doctor if you notice any change in your hearing. You can have tests to find out how well you hear.  ?? Diabetes. Ask your doctor whether you should have tests for diabetes.  ?? Colon cancer. You should begin tests for colon cancer at age 13. You may have one of several tests. Your doctor will tell you how often to have tests based on your age and risk.  Risks include whether you already had a precancerous polyp removed from your colon or whether your parents, sisters and brothers, or children have had colon cancer.  ?? Thyroid disease. Talk to your doctor about whether to have your thyroid checked as part of a regular physical exam. Women have an increased chance of a thyroid problem.  ?? Osteoporosis. You should begin tests for bone density at age 55. If you are younger than 61, ask your doctor whether you have factors that may increase your risk for this disease. You may want to have this test before age 51.  ?? Heart attack and stroke risk. At least every 4 to 6 years, you should have your risk for heart attack and stroke assessed. Your doctor uses factors such as your age, blood pressure, cholesterol, and whether you smoke or have diabetes to show what your risk for a heart attack or stroke is over the next 10 years.  When should you call for help?  Watch closely for changes in your health, and be sure to contact your doctor if you have any problems or symptoms that concern you.   Where can you learn more?   Go to [facility]  Enter [phone] in the search box to learn more about "Well Visit, Women 50 to 65: After Your Visit."   ?? 2006-2014 [employer], [employer]. Care instructions adapted under license by [employer] (which disclaims liability or warranty for this information). This care  instruction is for use with your licensed healthcare professional. If you have questions about a medical condition or this instruction, always ask your healthcare professional. Health[employer], Incorporated disclaims any warranty or liability for your use of this information.  Content Version: 10.2.346038; Current as of: April 05, 2013

## 2013-11-20 NOTE — Progress Notes (Signed)
Subjective:   57 y.o. female for annual routine Pap and checkup. No abnormal pap smears in the past.  Last pap 03/2012; requesting pap today.  No LMP recorded. Patient is postmenopausal.    Social History: single partner, contraception - post menopausal status.  Pertinent past medical hstory: no history of HTN, DVT, CAD, DM, liver disease, migraines or smoking.    Current Outpatient Prescriptions   Medication Sig Dispense Refill   ??? omega-3 fatty acids-vitamin e (FISH OIL) 1,000 mg cap Take 1 capsule by mouth.       ??? Biotin 2,500 mcg cap Take  by mouth.       ??? trimethoprim-sulfamethoxazole (BACTRIM DS, SEPTRA DS) 160-800 mg per tablet Take 1 tablet by mouth two (2) times a day.  10 tablet  0     No Known Allergies  Past Medical History   Diagnosis Date   ??? Anemia 08/25/2011     Past Surgical History   Procedure Laterality Date   ??? Endoscopy, colon, diagnostic       2005 normal   ??? Hx colonoscopy  2014     normal -- repeat in 10 years     Family History   Problem Relation Age of Onset   ??? Diabetes Mother    ??? Psychiatric Disorder Father    ??? Alcohol abuse Father    ??? Diabetes Brother    ??? No Known Problems Sister    ??? Psychiatric Disorder Brother      suicide   ??? No Known Problems Brother    ??? No Known Problems Daughter    ??? No Known Problems Daughter    ??? No Known Problems Daughter    ??? No Known Problems Daughter      History   Substance Use Topics   ??? Smoking status: Never Smoker    ??? Smokeless tobacco: Never Used   ??? Alcohol Use: No        ROS:  Feeling well. No dyspnea or chest pain on exertion.  No abdominal pain, change in bowel habits, black or bloody stools.  No urinary tract symptoms. GYN ROS: no breast pain or new or enlarging lumps on self exam, no vaginal bleeding, she complains of some urinary frequency and slight discomfort with intercourse last week. No neurological complaints.    Objective:   BP 139/77   Pulse 74   Temp(Src) 98.2 ??F (36.8 ??C) (Oral)   Resp 12   Ht 5' 5.5" (1.664 m)   Wt 188 lb 12.8  oz (85.639 kg)   BMI 30.93 kg/m2   SpO2 99%  The patient appears well, alert, oriented x 3, in no distress.  ENT normal.  Neck supple. No adenopathy or thyromegaly. PERLA. Lungs are clear, good air entry, no wheezes, rhonchi or rales. S1 and S2 normal, no murmurs, regular rate and rhythm. Abdomen soft without tenderness, guarding, mass or organomegaly. Extremities show no edema, normal peripheral pulses. Neurological is normal, no focal findings.    BREAST EXAM: breasts appear normal, no suspicious masses, no skin or nipple changes or axillary nodes    PELVIC EXAM: normal external genitalia, vulva, vagina, cervix, uterus and adnexa, thin white discharge in vaginal vault    Assessment/Plan:   well woman  pap smear  additional lab tests per orders  return annually or prn  Chizaram was seen today for well woman.    Diagnoses and associated orders for this visit:    Well woman exam with routine gynecological  exam - had normal mammogram 11/2013; had normal colonoscopy 05/2013  - CBC WITH AUTOMATED DIFF  - METABOLIC PANEL, COMPREHENSIVE  - LIPID PANEL  - TSH, 3RD GENERATION  - VITAMIN D, 25 HYDROXY  - PAP IG, APTIMA HPV AND RFX 16/18,45 (161096)    Urinary frequency  - AMB POC URINALYSIS DIP STICK AUTO W/O MICRO  - CULTURE, URINE  - trimethoprim-sulfamethoxazole (BACTRIM DS, SEPTRA DS) 160-800 mg per tablet; Take 1 tablet by mouth two (2) times a day.    Vaginitis  - NUSWAB VAGINITIS    Other Orders  - omega-3 fatty acids-vitamin e (FISH OIL) 1,000 mg cap; Take 1 capsule by mouth.  - Biotin 2,500 mcg cap; Take  by mouth.    [name] was counseled on age-appropriate/ guideline-based risk prevention behaviors and screening for a 57 y.o. year old   female .  We also discussed adjustments in screening based on family history if necessary.   Printed instructions for preventative screening guidelines and healthy behaviors given to patient with after visit summary.          lab results and schedule of future lab studies  reviewed with patient  reviewed diet, exercise and weight control  cardiovascular risk and specific lipid/LDL goals reviewed  reviewed medications and side effects in detail.

## 2013-11-21 ENCOUNTER — Encounter

## 2013-11-21 LAB — CBC WITH AUTOMATED DIFF
ABS. BASOPHILS: 0 10*3/uL (ref 0.0–0.2)
ABS. EOSINOPHILS: 0.1 10*3/uL (ref 0.0–0.4)
ABS. IMM. GRANS.: 0 10*3/uL (ref 0.0–0.1)
ABS. MONOCYTES: 0.2 10*3/uL (ref 0.1–0.9)
ABS. NEUTROPHILS: 2.1 10*3/uL (ref 1.4–7.0)
Abs Lymphocytes: 1.4 10*3/uL (ref 0.7–3.1)
BASOPHILS: 1 %
EOSINOPHILS: 3 %
HCT: 36.8 % (ref 34.0–46.6)
HGB: 12.3 g/dL (ref 11.1–15.9)
IMMATURE GRANULOCYTES: 0 %
Lymphocytes: 37 %
MCH: 30.8 pg (ref 26.6–33.0)
MCHC: 33.4 g/dL (ref 31.5–35.7)
MCV: 92 fL (ref 79–97)
MONOCYTES: 5 %
NEUTROPHILS: 54 %
PLATELET: 233 10*3/uL (ref 150–379)
RBC: 3.99 x10E6/uL (ref 3.77–5.28)
RDW: 12.9 % (ref 12.3–15.4)
WBC: 3.9 10*3/uL (ref 3.4–10.8)

## 2013-11-21 LAB — METABOLIC PANEL, COMPREHENSIVE
A-G Ratio: 1.8 (ref 1.1–2.5)
ALT (SGPT): 21 IU/L (ref 0–32)
AST (SGOT): 21 IU/L (ref 0–40)
Albumin: 4.6 g/dL (ref 3.5–5.5)
Alk. phosphatase: 84 IU/L (ref 39–117)
BUN/Creatinine ratio: 14 (ref 9–23)
BUN: 11 mg/dL (ref 6–24)
Bilirubin, total: 0.4 mg/dL (ref 0.0–1.2)
CO2: 29 mmol/L (ref 18–29)
Calcium: 9.6 mg/dL (ref 8.7–10.2)
Chloride: 101 mmol/L (ref 97–108)
Creatinine: 0.77 mg/dL (ref 0.57–1.00)
GFR est AA: 98 mL/min/{1.73_m2} (ref >59)
GFR est non-AA: 85 mL/min/{1.73_m2} (ref >59)
GLOBULIN, TOTAL: 2.5 g/dL (ref 1.5–4.5)
Glucose: 87 mg/dL (ref 65–99)
Potassium: 4.1 mmol/L (ref 3.5–5.2)
Protein, total: 7.1 g/dL (ref 6.0–8.5)
Sodium: 143 mmol/L (ref 134–144)

## 2013-11-21 LAB — CVD REPORT: PDF IMAGE: 0

## 2013-11-21 LAB — TSH 3RD GENERATION: TSH: 2.14 u[IU]/mL (ref 0.450–4.500)

## 2013-11-21 LAB — LIPID PANEL
Cholesterol, total: 212 mg/dL — ABNORMAL HIGH (ref 100–199)
HDL Cholesterol: 58 mg/dL (ref >39)
LDL, calculated: 133 mg/dL — ABNORMAL HIGH (ref 0–99)
Triglyceride: 107 mg/dL (ref 0–149)
VLDL, calculated: 21 mg/dL (ref 5–40)

## 2013-11-21 LAB — VITAMIN D, 25 HYDROXY: VITAMIN D, 25-HYDROXY: 10.3 ng/mL — ABNORMAL LOW (ref 30.0–100.0)

## 2013-11-21 NOTE — Progress Notes (Signed)
Quick Note:    Message sent; Vitamin D 16109 units weekly sent to pharmacy  ______

## 2013-11-23 LAB — CULTURE, URINE

## 2013-11-23 LAB — NUSWAB VAGINITIS
C. albicans, NAA: NEGATIVE
C. glabrata, NAA: NEGATIVE
T. vaginalis, NAA: NEGATIVE

## 2013-11-23 NOTE — Progress Notes (Signed)
Quick Note:    Message sent  ______

## 2013-11-28 NOTE — Progress Notes (Signed)
Quick Note:    Message sent  ______

## 2014-05-10 NOTE — Progress Notes (Signed)
Quick Note:    addtl views done 06/16/10 WNL  ______

## 2014-11-12 ENCOUNTER — Encounter

## 2014-11-18 ENCOUNTER — Inpatient Hospital Stay: Admit: 2014-11-18 | Payer: PRIVATE HEALTH INSURANCE | Attending: Internal Medicine | Primary: Internal Medicine

## 2014-11-18 DIAGNOSIS — Z1231 Encounter for screening mammogram for malignant neoplasm of breast: Secondary | ICD-10-CM

## 2014-11-21 ENCOUNTER — Encounter: Attending: Nurse Practitioner | Primary: Internal Medicine

## 2014-11-22 ENCOUNTER — Ambulatory Visit
Admit: 2014-11-22 | Discharge: 2014-11-22 | Payer: PRIVATE HEALTH INSURANCE | Attending: Nurse Practitioner | Primary: Internal Medicine

## 2014-11-22 DIAGNOSIS — J029 Acute pharyngitis, unspecified: Secondary | ICD-10-CM

## 2014-11-22 LAB — AMB POC RAPID STREP A: Group A Strep Ag: NEGATIVE

## 2014-11-22 NOTE — Progress Notes (Signed)
Chief Complaint   Patient presents with   ??? Sore Throat     sore throat since last night. No fever or other associated symptoms.     HISTORY OF PRESENT ILLNESS  Melanie Hudson is a 58 y.o. female who presents with symptoms as above.  Sore Throat   The history is provided by the patient. This is a new problem. The current episode started yesterday. The problem has not changed since onset.There has been no fever. Pertinent negatives include no vomiting, no congestion, no ear discharge, no ear pain, no headaches, no plugged ear sensation, no shortness of breath, no swollen glands, no trouble swallowing and no cough. She has had no exposure to strep or mono. She has tried nothing for the symptoms.       Review of Systems   Constitutional: Negative for fever, chills and malaise/fatigue.   HENT: Positive for sore throat. Negative for congestion, ear discharge, ear pain and trouble swallowing.    Eyes: Negative for discharge and redness.   Respiratory: Negative for cough and shortness of breath.    Cardiovascular: Negative for chest pain and palpitations.   Gastrointestinal: Negative for vomiting.   Musculoskeletal: Negative for neck pain.   Neurological: Negative for headaches.   Endo/Heme/Allergies: Negative.      Past Medical History   Diagnosis Date   ??? Anemia 08/25/2011     Family History   Problem Relation Age of Onset   ??? Diabetes Mother    ??? Psychiatric Disorder Father    ??? Alcohol abuse Father    ??? Diabetes Brother    ??? No Known Problems Sister    ??? Psychiatric Disorder Brother      suicide   ??? No Known Problems Brother    ??? No Known Problems Daughter    ??? No Known Problems Daughter    ??? No Known Problems Daughter    ??? No Known Problems Daughter      History     Social History   ??? Marital Status: MARRIED     Spouse Name: N/A     Number of Children: N/A   ??? Years of Education: N/A     Occupational History   ??? Not on file.     Social History Main Topics   ??? Smoking status: Never Smoker     ??? Smokeless tobacco: Never Used   ??? Alcohol Use: No   ??? Drug Use: Not on file   ??? Sexual Activity:     Partners: Male     Other Topics Concern   ??? Not on file     Social History Narrative     Objective:  BP 169/91 mmHg   Pulse 73   Temp(Src) 98.1 ??F (36.7 ??C) (Oral)   Resp 18   Ht 5' 6.5" (1.689 m)   Wt 198 lb (89.812 kg)   BMI 31.48 kg/m2    Physical Exam   Constitutional: She is oriented to person, place, and time. She appears well-developed and well-nourished. No distress.   HENT:   Head: Normocephalic and atraumatic.   Right Ear: External ear normal.   Left Ear: External ear normal.   Nose: Nose normal.   Mouth/Throat: Oropharynx is clear and moist. No oropharyngeal exudate.   Eyes: Conjunctivae are normal. Right eye exhibits no discharge. Left eye exhibits no discharge.   Neck: Normal range of motion. Neck supple. No thyromegaly present.   Cardiovascular: Normal rate and regular rhythm.  Exam reveals gallop.  BP checked again and was 148/68. Pulse 63.   Pulmonary/Chest: Effort normal and breath sounds normal. No respiratory distress. She has no wheezes. She has no rales.   Lymphadenopathy:     She has no cervical adenopathy.   Neurological: She is alert and oriented to person, place, and time.   Skin: Skin is warm and dry. She is not diaphoretic.   Psychiatric: She has a normal mood and affect.     ASSESSMENT and PLAN    ICD-10-CM ICD-9-CM    1. Pharyngitis J02.9 462 AMB POC RAPID STREP A     Lab results discussed and reviewed with patient (negative strep test).  Instructed warm salt water gargles, throat lozenges, Tylenol or Ibuprofen for pain/fever.  Advised if symptoms worsen or do not improve in 3-5 days to seek further medical evaluation.  Pt advised to self-monitor BP and discuss with PCP.  After visit summary discussed with and given to patient.

## 2014-11-22 NOTE — Patient Instructions (Signed)
Sore Throat: After Your Visit  Your Care Instructions     Infection by bacteria or a virus causes most sore throats. Cigarette smoke, dry air, air pollution, allergies, and yelling can also cause a sore throat. Sore throats can be painful and annoying. Fortunately, most sore throats go away on their own. If you have a bacterial infection, your doctor may prescribe antibiotics.  Follow-up care is a key part of your treatment and safety. Be sure to make and go to all appointments, and call your doctor if you are having problems. It's also a good idea to know your test results and keep a list of the medicines you take.  How can you care for yourself at home?  ?? If your doctor prescribed antibiotics, take them as directed. Do not stop taking them just because you feel better. You need to take the full course of antibiotics.  ?? Gargle with warm salt water once an hour to help reduce swelling and relieve discomfort. Use 1 teaspoon of salt mixed in 1 cup of warm water.  ?? Take an over-the-counter pain medicine, such as acetaminophen (Tylenol), ibuprofen (Advil, Motrin), or naproxen (Aleve). Read and follow all instructions on the label.  ?? Be careful when taking over-the-counter cold or flu medicines and Tylenol at the same time. Many of these medicines have acetaminophen, which is Tylenol. Read the labels to make sure that you are not taking more than the recommended dose. Too much acetaminophen (Tylenol) can be harmful.  ?? Drink plenty of fluids. Fluids may help soothe an irritated throat. Hot fluids, such as tea or soup, may help decrease throat pain.  ?? Use over-the-counter throat lozenges to soothe pain. Regular cough drops or hard candy may also help. These should not be given to young children because of the risk of choking.  ?? Do not smoke or allow others to smoke around you. If you need help quitting, talk to your doctor about stop-smoking programs and medicines.  These can increase your chances of quitting for good.  ?? Use a vaporizer or humidifier to add moisture to your bedroom. Follow the directions for cleaning the machine.  When should you call for help?  Call your doctor now or seek immediate medical care if:  ?? You have new or worse trouble swallowing.  ?? Your sore throat gets much worse on one side.  Watch closely for changes in your health, and be sure to contact your doctor if you do not get better as expected.   Where can you learn more?   Go to http://www.healthwise.net/[facility]  Enter U420 in the search box to learn more about "Sore Throat: After Your Visit."   ?? 2006-2015 Healthwise, Incorporated. Care instructions adapted under license by [employer] (which disclaims liability or warranty for this information). This care instruction is for use with your licensed healthcare professional. If you have questions about a medical condition or this instruction, always ask your healthcare professional. Healthwise, Inc[employer] disclaims any warranty or liability for your use of this information.  Content Version: 10.5.422740; Current as of: October 26, 2013              Rapid Strep Test: About This Test  What is it?     A rapid strep test checks the bacteria in your throat to see if strep is the cause of your sore throat.  Why is this test done?  It may be done so your doctor can find out right away whether you have   strep throat. There is another test for strep, called a throat culture, but that test takes a few days to get the results.  How can you prepare for the test?  You don't need to do anything before you have this test.  What happens during the test?  ?? You will be asked to tilt your head back and open your mouth as wide as possible.  ?? Your doctor will press your tongue down with a flat stick (tongue depressor) and then examine your mouth and throat.  ?? A clean cotton swab will be rubbed over the back of your throat, around  your tonsils, and over any red areas or sores to collect a sample.  How long does the test take?  ?? The test takes less than a minute.  ?? Results are available in 10 to 15 minutes.  When should you call for help?  Call your doctor now or seek immediate medical care if:  ?? Your pain gets worse on one side of your throat, or you have trouble opening your mouth.  ?? You have a new or higher fever, or you have a fever with a stiff neck or severe headache.  ?? Swallowing becomes harder, or you have any trouble breathing.  ?? You are sensitive to light or feel very sleepy or confused.  Watch closely for changes in your health, and be sure to contact your doctor if:  ?? You do not start to feel better after 2 days.  Follow-up care is a key part of your treatment and safety. Be sure to make and go to all appointments, and call your doctor if you are having problems. It's also a good idea to keep a list of the medicines you take. Ask your doctor when you can expect to have your test results.   Where can you learn more?   Go to http://www.healthwise.net/[facility]  Enter B356 in the search box to learn more about "Rapid Strep Test: About This Test."   ?? 2006-2015 Healthwise, Inc[employer]. Care instructions adapted under license by [employer] (which disclaims liability or warranty for this information). This care instruction is for use with your licensed healthcare professional. If you have questions about a medical condition or this instruction, always ask your healthcare professional. [employer], Inc[employer] disclaims any warranty or liability for your use of this information.  Content Version: 10.5.422740; Current as of: October 26, 2013

## 2014-12-23 ENCOUNTER — Inpatient Hospital Stay: Admit: 2014-12-25 | Payer: PRIVATE HEALTH INSURANCE | Primary: Internal Medicine

## 2014-12-23 ENCOUNTER — Ambulatory Visit
Admit: 2014-12-23 | Discharge: 2014-12-23 | Payer: PRIVATE HEALTH INSURANCE | Attending: Internal Medicine | Primary: Internal Medicine

## 2014-12-23 DIAGNOSIS — Z124 Encounter for screening for malignant neoplasm of cervix: Secondary | ICD-10-CM

## 2014-12-23 NOTE — Progress Notes (Signed)
HISTORY OF PRESENT ILLNESS  Melanie Hudson is a 59 y.o. female.  HPI  She did have some shoulder pain and ti is getting better working ; sold their house and went to apartment       Review of Systems   Constitutional: Negative for fever, chills, weight loss, malaise/fatigue and diaphoresis.   HENT: Negative for congestion, ear pain, hearing loss, sore throat and tinnitus.    Eyes: Negative for blurred vision and double vision.   Respiratory: Negative for cough, hemoptysis, sputum production, shortness of breath and wheezing.    Cardiovascular: Negative for chest pain, palpitations, orthopnea, claudication, leg swelling and PND.   Gastrointestinal: Negative for heartburn, nausea, vomiting, abdominal pain, diarrhea, constipation and blood in stool.   Genitourinary: Negative for dysuria, urgency, frequency, hematuria and flank pain.   Musculoskeletal: Negative for myalgias, back pain, joint pain and neck pain.   Skin: Negative.    Neurological: Negative for dizziness, tingling, sensory change, speech change, focal weakness, seizures, loss of consciousness, weakness and headaches.   Endo/Heme/Allergies: Negative for environmental allergies and polydipsia. Does not bruise/bleed easily.   Psychiatric/Behavioral: Negative for depression, suicidal ideas, memory loss and substance abuse. The patient is not nervous/anxious and does not have insomnia.        Physical Exam   Constitutional: She is oriented to person, place, and time. She appears well-developed and well-nourished.   HENT:   Head: Normocephalic and atraumatic.   Right Ear: External ear normal.   Left Ear: External ear normal.   Nose: Nose normal.   Mouth/Throat: Oropharynx is clear and moist.   Eyes: Conjunctivae and EOM are normal. Pupils are equal, round, and reactive to light.   Neck: Normal range of motion. Neck supple.   Cardiovascular: Normal rate, regular rhythm, normal heart sounds and intact distal pulses.     Pulmonary/Chest: Effort normal and breath sounds normal. Right breast exhibits no inverted nipple, no mass, no nipple discharge, no skin change and no tenderness. Left breast exhibits no inverted nipple, no mass, no nipple discharge, no skin change and no tenderness. Breasts are symmetrical.   Abdominal: Soft. Bowel sounds are normal.   Genitourinary: Rectum normal and vagina normal. Rectal exam shows anal tone normal. Guaiac negative stool. No breast swelling, tenderness, discharge or bleeding.   Musculoskeletal: Normal range of motion.   Neurological: She is alert and oriented to person, place, and time.   Skin: Skin is warm and dry.   Psychiatric: She has a normal mood and affect. Her behavior is normal. Judgment and thought content normal.   Nursing note and vitals reviewed.      ASSESSMENT and PLAN  Melanie Hudson was seen today for well woman.    Diagnoses and all orders for this visit:    Routine Papanicolaou smear    Routine general medical examination at a health care facility  Orders:  -     METABOLIC PANEL, COMPREHENSIVE  -     CBC WITH AUTOMATED DIFF  -     LIPID PANEL  -     TSH, 3RD GENERATION  -     VITAMIN D, 25 HYDROXY      lab results and schedule of future lab studies reviewed with patient  reviewed diet, exercise and weight control  cardiovascular risk and specific lipid/LDL goals reviewed  reviewed medications and side effects in detail

## 2014-12-23 NOTE — Addendum Note (Signed)
Addended by: Larita FifeHIGGINBOTHAM, KRISTEN H on: 12/23/2014 03:06 PM      Modules accepted: Orders

## 2014-12-24 LAB — CBC WITH AUTOMATED DIFF
ABS. BASOPHILS: 0 10*3/uL (ref 0.0–0.2)
ABS. EOSINOPHILS: 0.2 10*3/uL (ref 0.0–0.4)
ABS. IMM. GRANS.: 0 10*3/uL (ref 0.0–0.1)
ABS. MONOCYTES: 0.3 10*3/uL (ref 0.1–0.9)
ABS. NEUTROPHILS: 2.5 10*3/uL (ref 1.4–7.0)
Abs Lymphocytes: 1.8 10*3/uL (ref 0.7–3.1)
BASOPHILS: 1 %
EOSINOPHILS: 4 %
HCT: 35.3 % (ref 34.0–46.6)
HGB: 12 g/dL (ref 11.1–15.9)
IMMATURE GRANULOCYTES: 0 %
Lymphocytes: 37 %
MCH: 30.8 pg (ref 26.6–33.0)
MCHC: 34 g/dL (ref 31.5–35.7)
MCV: 91 fL (ref 79–97)
MONOCYTES: 7 %
NEUTROPHILS: 51 %
PLATELET: 249 10*3/uL (ref 150–379)
RBC: 3.89 x10E6/uL (ref 3.77–5.28)
RDW: 13.6 % (ref 12.3–15.4)
WBC: 4.9 10*3/uL (ref 3.4–10.8)

## 2014-12-24 LAB — LIPID PANEL
Cholesterol, total: 229 mg/dL — ABNORMAL HIGH (ref 100–199)
HDL Cholesterol: 52 mg/dL (ref >39)
LDL, calculated: 151 mg/dL — ABNORMAL HIGH (ref 0–99)
Triglyceride: 129 mg/dL (ref 0–149)
VLDL, calculated: 26 mg/dL (ref 5–40)

## 2014-12-24 LAB — METABOLIC PANEL, COMPREHENSIVE
A-G Ratio: 1.8 (ref 1.1–2.5)
ALT (SGPT): 25 IU/L (ref 0–32)
AST (SGOT): 22 IU/L (ref 0–40)
Albumin: 4.7 g/dL (ref 3.6–4.8)
Alk. phosphatase: 84 IU/L (ref 39–117)
BUN/Creatinine ratio: 18 (ref 11–26)
BUN: 13 mg/dL (ref 8–27)
Bilirubin, total: 0.3 mg/dL (ref 0.0–1.2)
CO2: 26 mmol/L (ref 18–29)
Calcium: 9.5 mg/dL (ref 8.7–10.3)
Chloride: 102 mmol/L (ref 97–108)
Creatinine: 0.73 mg/dL (ref 0.57–1.00)
GFR est AA: 104 mL/min/{1.73_m2} (ref >59)
GFR est non-AA: 90 mL/min/{1.73_m2} (ref >59)
GLOBULIN, TOTAL: 2.6 g/dL (ref 1.5–4.5)
Glucose: 84 mg/dL (ref 65–99)
Potassium: 4.2 mmol/L (ref 3.5–5.2)
Protein, total: 7.3 g/dL (ref 6.0–8.5)
Sodium: 142 mmol/L (ref 134–144)

## 2014-12-24 LAB — CVD REPORT

## 2014-12-24 LAB — TSH 3RD GENERATION: TSH: 2.08 u[IU]/mL (ref 0.450–4.500)

## 2014-12-24 LAB — VITAMIN D, 25 HYDROXY: VITAMIN D, 25-HYDROXY: 13.1 ng/mL — ABNORMAL LOW (ref 30.0–100.0)

## 2015-11-20 ENCOUNTER — Ambulatory Visit
Admit: 2015-11-20 | Discharge: 2015-11-20 | Payer: PRIVATE HEALTH INSURANCE | Attending: Internal Medicine | Primary: Internal Medicine

## 2015-11-20 DIAGNOSIS — M67471 Ganglion, right ankle and foot: Secondary | ICD-10-CM

## 2015-11-20 NOTE — Progress Notes (Signed)
HISTORY OF PRESENT ILLNESS  Melanie Hudson is a 59 y.o. female.  HPI     She complains of Melanie Cruisea knot on the lateral aspect of her right foot. It was present at her last office visit (1/16)- it was soft and she thought it was going to resolve. She states that this knot has become hard although does not cause pain or difficulty walking.    Her bp was 170/81 in the office today. She states that her bp runs around 120-130/80 at home.    Reports she has experienced hair loss.    Pt is moving to [name], [place] and is currently living in [name], [name].    Review of Systems   All other systems reviewed and are negative.      Physical Exam   Constitutional: She is oriented to person, place, and time. She appears well-developed and well-nourished.   HENT:   Head: Normocephalic and atraumatic.   Right Ear: External ear normal.   Left Ear: External ear normal.   Nose: Nose normal.   Mouth/Throat: Oropharynx is clear and moist.   Eyes: Conjunctivae and EOM are normal.   Neck: Normal range of motion. Neck supple. Carotid bruit is not present. No thyroid mass and no thyromegaly present.   Cardiovascular: Normal rate, regular rhythm, S1 normal, S2 normal, normal heart sounds and intact distal pulses.    Pulmonary/Chest: Effort normal and breath sounds normal.   Abdominal: Soft. Normal appearance and bowel sounds are normal. There is no hepatosplenomegaly. There is no tenderness.   Musculoskeletal: Normal range of motion.   Neurological: She is alert and oriented to person, place, and time. She has normal strength. No cranial nerve deficit or sensory deficit. Coordination normal.   Skin: Skin is warm, dry and intact. No abrasion and no rash noted.   Knot on lateral aspect of right foot- uniform and soft around the edges; consistent with cyst   Psychiatric: She has a normal mood and affect. Her behavior is normal. Judgment and thought content normal.   Nursing note and vitals reviewed.      ASSESSMENT and PLAN   Melanie Hudson was seen today for cyst.    Diagnoses and all orders for this visit:    Ganglion cyst of right foot  I told pt that knot is cystic although not as soft as it was. Knot does not feel bony and is not concerning or irregular- it is uniform and soft around the edges. Pt should see podiatrist to have this removed- I would refer her to podiatrist here although she is living in [name].    Suggested pt see dermatologist to discuss hair loss.     Written by Winfield RastErin Wallace, ScribeKick, as dictated by Tora KindredSonia Shah-Pandya, MD.

## 2015-11-20 NOTE — Progress Notes (Signed)
Formatting of this note might be different from the original.  Melanie Hudson is a 59 y.o. female.  HPI     She complains of a knot on the lateral aspect of her right foot. It was present at her last office visit (1/16)- it was soft and she thought it was going to resolve. She states that this knot has become hard although does not cause pain or difficulty walking.    Her bp was 170/81 in the office today. She states that her bp runs around 120-130/80 at home.    Reports she has experienced hair loss.    Pt is moving to [place], [place] and is currently living in [place], [place].    Review of Systems   All other systems reviewed and are negative.    Physical Exam   Constitutional: She is oriented to person, place, and time. She appears well-developed and well-nourished.   HENT:   Head: Normocephalic and atraumatic.   Right Ear: External ear normal.   Left Ear: External ear normal.   Nose: Nose normal.   Mouth/Throat: Oropharynx is clear and moist.   Eyes: Conjunctivae and EOM are normal.   Neck: Normal range of motion. Neck supple. Carotid bruit is not present. No thyroid mass and no thyromegaly present.   Cardiovascular: Normal rate, regular rhythm, S1 normal, S2 normal, normal heart sounds and intact distal pulses.    Pulmonary/Chest: Effort normal and breath sounds normal.   Abdominal: Soft. Normal appearance and bowel sounds are normal. There is no hepatosplenomegaly. There is no tenderness.   Musculoskeletal: Normal range of motion.   Neurological: She is alert and oriented to person, place, and time. She has normal strength. No cranial nerve deficit or sensory deficit. Coordination normal.   Skin: Skin is warm, dry and intact. No abrasion and no rash noted.   Knot on lateral aspect of right foot- uniform and soft around the edges; consistent with cyst   Psychiatric: She has a normal mood and affect. Her behavior is normal. Judgment and thought content normal.   Nursing note and vitals  reviewed.    ASSESSMENT and PLAN  Tahreem was seen today for cyst.    Diagnoses and all orders for this visit:    Ganglion cyst of right foot  I told pt that knot is cystic although not as soft as it was. Knot does not feel bony and is not concerning or irregular- it is uniform and soft around the edges. Pt should see podiatrist to have this removed- I would refer her to podiatrist here although she is living in [place].    Suggested pt see dermatologist to discuss hair loss.     Written by Maren Reamer, ScribeKick, as dictated by Girtha Hake, MD.     Electronically signed by Girtha Hake, MD at 11/20/2015  8:28 PM EST

## 2016-07-28 ENCOUNTER — Encounter: Payer: Self-pay | Admitting: *Deleted

## 2016-07-28 NOTE — Patient Instructions (Signed)
  Your procedure is scheduled on: 07-30-16 (FRIDAY) Report to Same Day Surgery 2nd floor medical mall To find out your arrival time please call [phone] between [place] on 07-29-16 [facility])  Remember: Instructions that are not followed completely may result in serious medical risk, up to and including death, or upon the discretion of your surgeon and anesthesiologist your surgery may need to be rescheduled.    _x___ 1. Do not eat food or drink liquids after midnight. No gum chewing or hard candies.     __x__ 2. No Alcohol for 24 hours before or after surgery.   __x__3. No Smoking for 24 prior to surgery.   ____  4. Bring all medications with you on the day of surgery if instructed.    __x__ 5. Notify your doctor if there is any change in your medical condition     (cold, fever, infections).     Do not wear jewelry, make-up, hairpins, clips or nail polish.  Do not wear lotions, powders, or perfumes. You may wear deodorant.  Do not shave 48 hours prior to surgery. Men may shave face and neck.  Do not bring valuables to the hospital.    [facility] is not responsible for any belongings or valuables.               Contacts, dentures or bridgework may not be worn into surgery.  Leave your suitcase in the car. After surgery it may be brought to your room.  For patients admitted to the hospital, discharge time is determined by your treatment team.   Patients discharged the day of surgery will not be allowed to drive home.    Please read over the following fact sheets that you were given:   [facility] Preparing for Surgery and or MRSA Information   ____ Take these medicines the morning of surgery with A SIP OF WATER:    1. NONE  2.  3.  4.  5.  6.  ____ Fleet Enema (as directed)   _x___ Use CHG Soap or sage wipes as directed on instruction sheet   ____ Use inhalers on the day of surgery and bring to hospital day of surgery  ____ Stop metformin 2 days prior to  surgery    ____ Take 1/2 of usual insulin dose the night before surgery and none on the morning of  surgery.   ____ Stop aspirin or coumadin, or plavix  _x__ Stop Anti-inflammatories such as Advil, Aleve, Ibuprofen, Motrin, Naproxen,          Naprosyn, Goodies powders or aspirin products. Ok to take Tylenol.   _X___ Stop supplements until after surgery-STOP FISH OIL AND FLAXSEED OIL NOW  ____ Bring C-Pap to the hospital.

## 2016-07-29 ENCOUNTER — Encounter
Admission: RE | Admit: 2016-07-29 | Discharge: 2016-07-29 | Disposition: A | Discharge status: Home / Self Care | Payer: BLUE CROSS/BLUE SHIELD | Source: Ambulatory Visit | Attending: Podiatry | Admitting: Podiatry

## 2016-07-29 DIAGNOSIS — D2371 Other benign neoplasm of skin of right lower limb, including hip: Secondary | ICD-10-CM | POA: Diagnosis not present

## 2016-07-29 DIAGNOSIS — L729 Follicular cyst of the skin and subcutaneous tissue, unspecified: Secondary | ICD-10-CM | POA: Diagnosis present

## 2016-07-29 LAB — DIFFERENTIAL
BASOS PCT: 1 %
Basophils Absolute: 0.1 10*3/uL (ref 0–0.1)
EOS ABS: 0.1 10*3/uL (ref 0–0.7)
Eosinophils Relative: 3 %
Lymphocytes Relative: 35 %
Lymphs Abs: 1.7 10*3/uL (ref 1.0–3.6)
MONO ABS: 0.3 10*3/uL (ref 0.2–0.9)
MONOS PCT: 7 %
Neutro Abs: 2.5 10*3/uL (ref 1.4–6.5)
Neutrophils Relative %: 54 %

## 2016-07-29 LAB — CBC
HEMATOCRIT: 36.4 % (ref 35.0–47.0)
Hemoglobin: 12.7 g/dL (ref 12.0–16.0)
MCH: 31.4 pg (ref 26.0–34.0)
MCHC: 34.8 g/dL (ref 32.0–36.0)
MCV: 90.3 fL (ref 80.0–100.0)
PLATELETS: 204 10*3/uL (ref 150–440)
RBC: 4.03 MIL/uL (ref 3.80–5.20)
RDW: 12.9 % (ref 11.5–14.5)
WBC: 4.7 10*3/uL (ref 3.6–11.0)

## 2016-07-30 ENCOUNTER — Ambulatory Visit
Admission: RE | Admit: 2016-07-30 | Discharge: 2016-07-30 | Disposition: A | Discharge status: Home / Self Care | Payer: BLUE CROSS/BLUE SHIELD | Source: Ambulatory Visit | Attending: Podiatry | Admitting: Podiatry

## 2016-07-30 ENCOUNTER — Ambulatory Visit: Payer: BLUE CROSS/BLUE SHIELD | Admitting: Anesthesiology

## 2016-07-30 ENCOUNTER — Encounter
Admission: RE | Disposition: A | Discharge status: Home / Self Care | Payer: Self-pay | Source: Ambulatory Visit | Attending: Podiatry

## 2016-07-30 ENCOUNTER — Encounter: Payer: Self-pay | Admitting: *Deleted

## 2016-07-30 DIAGNOSIS — D2371 Other benign neoplasm of skin of right lower limb, including hip: Secondary | ICD-10-CM | POA: Insufficient documentation

## 2016-07-30 HISTORY — DX: Anemia, unspecified: D64.9

## 2016-07-30 HISTORY — PX: GANGLION CYST EXCISION: SHX1691

## 2016-07-30 SURGERY — EXCISION, GANGLION CYST, FOOT
Anesthesia: General | Laterality: Right

## 2016-07-30 MED ORDER — BUPIVACAINE HCL (PF) 0.5 % IJ SOLN
INTRAMUSCULAR | Status: AC
  Filled 2016-07-30: qty 30

## 2016-07-30 MED ORDER — FENTANYL CITRATE (PF) 100 MCG/2ML IJ SOLN: 25.0000 ug | INTRAMUSCULAR | Status: DC | PRN

## 2016-07-30 MED ORDER — BUPIVACAINE HCL (PF) 0.5 % IJ SOLN
INTRAMUSCULAR | Status: DC | PRN
  Administered 2016-07-30: 10 mL

## 2016-07-30 MED ORDER — ONDANSETRON HCL 4 MG/2ML IJ SOLN
INTRAMUSCULAR | Status: DC | PRN
  Administered 2016-07-30: 4 mg via INTRAVENOUS

## 2016-07-30 MED ORDER — FAMOTIDINE 20 MG PO TABS
ORAL_TABLET | ORAL | Status: AC
  Administered 2016-07-30: 20 mg via ORAL
  Filled 2016-07-30: qty 1

## 2016-07-30 MED ORDER — HYDROCODONE-ACETAMINOPHEN 5-325 MG PO TABS: 1.0000 | ORAL_TABLET | ORAL | 0 refills | Status: AC | PRN

## 2016-07-30 MED ORDER — FAMOTIDINE 20 MG PO TABS
20.0000 mg | ORAL_TABLET | Freq: Once | ORAL | Status: AC
  Administered 2016-07-30: 20 mg via ORAL

## 2016-07-30 MED ORDER — LIDOCAINE HCL (CARDIAC) 20 MG/ML IV SOLN
INTRAVENOUS | Status: DC | PRN
  Administered 2016-07-30: 100 mg via INTRAVENOUS

## 2016-07-30 MED ORDER — MIDAZOLAM HCL 2 MG/2ML IJ SOLN
INTRAMUSCULAR | Status: DC | PRN
  Administered 2016-07-30: 2 mg via INTRAVENOUS

## 2016-07-30 MED ORDER — LACTATED RINGERS IV SOLN
INTRAVENOUS | Status: DC
  Administered 2016-07-30: 07:00:00 via INTRAVENOUS

## 2016-07-30 MED ORDER — PROMETHAZINE HCL 25 MG/ML IJ SOLN: 6.2500 mg | INTRAMUSCULAR | Status: DC | PRN

## 2016-07-30 MED ORDER — PROPOFOL 10 MG/ML IV BOLUS
INTRAVENOUS | Status: DC | PRN
  Administered 2016-07-30: 200 mg via INTRAVENOUS

## 2016-07-30 SURGICAL SUPPLY — 30 items
BANDAGE ELASTIC 4 LF NS (GAUZE/BANDAGES/DRESSINGS) ×3 IMPLANT
BANDAGE STRETCH 3X4.1 STRL (GAUZE/BANDAGES/DRESSINGS) ×3 IMPLANT
BNDG ESMARK 4X12 TAN STRL LF (GAUZE/BANDAGES/DRESSINGS) ×3 IMPLANT
BNDG GAUZE 4.5X4.1 6PLY STRL (MISCELLANEOUS) ×3 IMPLANT
CANISTER SUCT 1200ML W/VALVE (MISCELLANEOUS) ×3 IMPLANT
CLOSURE WOUND 1/4X4 (GAUZE/BANDAGES/DRESSINGS) ×1
CUFF TOURN 18 STER (MISCELLANEOUS) IMPLANT
CUFF TOURN DUAL PL 12 NO SLV (MISCELLANEOUS) ×3 IMPLANT
DURAPREP 26ML APPLICATOR (WOUND CARE) ×3 IMPLANT
ELECT REM PT RETURN 9FT ADLT (ELECTROSURGICAL) ×3
ELECTRODE REM PT RTRN 9FT ADLT (ELECTROSURGICAL) ×1 IMPLANT
GAUZE PETRO XEROFOAM 1X8 (MISCELLANEOUS) ×3 IMPLANT
GAUZE SPONGE 4X4 12PLY STRL (GAUZE/BANDAGES/DRESSINGS) ×3 IMPLANT
GLOVE BIO SURGEON STRL SZ7.5 (GLOVE) ×3 IMPLANT
GLOVE INDICATOR 8.0 STRL GRN (GLOVE) ×3 IMPLANT
GOWN STRL REUS W/ TWL LRG LVL3 (GOWN DISPOSABLE) ×1 IMPLANT
GOWN STRL REUS W/TWL LRG LVL3 (GOWN DISPOSABLE) ×2
KIT RM TURNOVER STRD PROC AR (KITS) ×3 IMPLANT
LABEL OR SOLS (LABEL) IMPLANT
NDL SAFETY 25GX1.5 (NEEDLE) ×3 IMPLANT
NS IRRIG 500ML POUR BTL (IV SOLUTION) ×3 IMPLANT
PACK EXTREMITY ARMC (MISCELLANEOUS) ×3 IMPLANT
PENCIL ELECTRO HAND CTR (MISCELLANEOUS) IMPLANT
SOL PREP PVP 2OZ (MISCELLANEOUS) ×3
SOLUTION PREP PVP 2OZ (MISCELLANEOUS) ×1 IMPLANT
STOCKINETTE STRL 6IN 960660 (GAUZE/BANDAGES/DRESSINGS) ×3 IMPLANT
STRIP CLOSURE SKIN 1/4X4 (GAUZE/BANDAGES/DRESSINGS) ×2 IMPLANT
SUT ETHILON 5-0 FS-2 18 BLK (SUTURE) ×3 IMPLANT
SUT VIC AB 4-0 FS2 27 (SUTURE) ×3 IMPLANT
SYRINGE 10CC LL (SYRINGE) ×3 IMPLANT

## 2016-07-30 NOTE — Anesthesia Procedure Notes (Signed)
Procedure Name: LMA Insertion Date/Time: 07/30/2016 7:44 AM Performed by: Silvana Newness Pre-anesthesia Checklist: Patient identified, Emergency Drugs available, Suction available, Patient being monitored and Timeout performed Patient Re-evaluated:Patient Re-evaluated prior to inductionOxygen Delivery Method: Circle system utilized Preoxygenation: Pre-oxygenation with 100% oxygen Intubation Type: IV induction Ventilation: Mask ventilation without difficulty LMA: LMA inserted LMA Size: 4.0 Number of attempts: 1 Placement Confirmation: positive ETCO2 and breath sounds checked- equal and bilateral Tube secured with: Tape Dental Injury: Teeth and Oropharynx as per pre-operative assessment

## 2016-07-30 NOTE — H&P (Signed)
  Medical history and physical reviewed. No interval change. Patient is stable for surgery

## 2016-07-30 NOTE — Op Note (Signed)
Date of operation: 07/30/2016.  Surgeon: Durward Fortes DPM  Preoperative diagnosis: Cyst right foot.  Postoperative diagnosis: Same pending pathology.  Procedure: Excision soft tissue cyst right foot.  Anesthesia: LMA with local.  Hemostasis: Pneumatic tourniquet right ankle 250 mmHg.  Estimated blood loss: Less than 5 cc.  Pathology: Soft tissue cyst right foot.  Drains: None.  Complications: None apparent.  Operative indications. This is a 60 year old female with a history of a painful and enlarging cyst on her right foot. Patient elects to have surgical removal.  Operative procedure: Patient was taken to the operating room and placed on the table in the supine position. Following satisfactory LMA anesthesia the right foot was anesthetized with 10 cc of 0.5% bupivacaine plain around the cystic lesion along the lateral aspect of the right foot. A pneumatic tourniquet was applied at the level of the right ankle and the foot was prepped and draped in usual sterile fashion. Foot was exsanguinated and the tourniquet inflated to 250 mmHg. Attention was then directed to the lateral aspect of the right foot where an approximate 3 cm lazy S incision was made coursing proximal to distal centered over the cystic area along the lateral aspect of the foot. The incision was deepened via sharp and blunt dissection down to the level of the cyst. Upon deepening the incision the cyst was punctured and a straw-colored fluid was extruded. Was not very viscous. The cyst at this point was clearly identified and carefully resected using sharp and blunt dissection from the normal surrounding anatomy and removed in toto. The wound was then flushed with copious amounts of sterile saline and closed using 4-0 Vicryl running suture for deep and superficial subcutaneous as well as skin closure. Tincture benzoin and Steri-Strips applied followed by Xeroform and a sterile bandage. Tourniquet was released and blood flow  noted to return immediately to the right foot and all digits. An Ace wrap was applied for compression. Patient tolerated the procedure and anesthesia well and was transported to the PACU with vital signs stable and in good condition.

## 2016-07-30 NOTE — Discharge Instructions (Addendum)
1. Elevate right lower extremity on 2 pillows.  2. Keep the bandage on the right foot clean, dry, and do not remove.  3. Sponge bathe only right lower extremity.  4. Wear surgical shoe on the right foot whenever walking or standing.  5. Take one pain pill, Norco, every 4 hours as needed for pain.   AMBULATORY SURGERY  DISCHARGE INSTRUCTIONS   1) The drugs that you were given will stay in your system until tomorrow so for the next 24 hours you should not:  A) Drive an automobile B) Make any legal decisions C) Drink any alcoholic beverage   2) You may resume regular meals tomorrow.  Today it is better to start with liquids and gradually work up to solid foods.  You may eat anything you prefer, but it is better to start with liquids, then soup and crackers, and gradually work up to solid foods.   3) Please notify your doctor immediately if you have any unusual bleeding, trouble breathing, redness and pain at the surgery site, drainage, fever, or pain not relieved by medication.    4) Additional Instructions:        Please contact your physician with any problems or Same Day Surgery at [phone], Monday through Friday 6 am to 4 pm, or [place] at [facility] number at [phone].

## 2016-07-30 NOTE — Interval H&P Note (Signed)
History and Physical Interval Note:  07/30/2016 7:12 AM  Sharon Santiago  has presented today for surgery, with the diagnosis of Epidermoid cyst L72.0  The various methods of treatment have been discussed with the patient and family. After consideration of risks, benefits and other options for treatment, the patient has consented to  Procedure(s): REMOVAL GANGLION CYST FOOT (Right) as a surgical intervention .  The patient's history has been reviewed, patient examined, no change in status, stable for surgery.  I have reviewed the patient's chart and labs.  Questions were answered to the patient's satisfaction.     Durward Fortes

## 2016-07-30 NOTE — Transfer of Care (Signed)
Immediate Anesthesia Transfer of Care Note  Patient: Sharon Santiago  Procedure(s) Performed: Procedure(s): REMOVAL GANGLION CYST FOOT (Right)  Patient Location: PACU  Anesthesia Type:General  Level of Consciousness: awake, alert , oriented and patient cooperative  Airway & Oxygen Therapy: Patient Spontanous Breathing and Patient connected to face mask oxygen  Post-op Assessment: Report given to RN, Post -op Vital signs reviewed and stable and Patient moving all extremities X 4  Post vital signs: Reviewed and stable  Last Vitals:  Vitals:   07/30/16 0614  BP: (!) 159/74  Pulse: 68  Resp: 16  Temp: 37.1 C    Last Pain:  Vitals:   07/30/16 0614  TempSrc: Oral         Complications: No apparent anesthesia complications

## 2016-07-30 NOTE — Anesthesia Preprocedure Evaluation (Signed)
Anesthesia Evaluation  Patient identified by MRN, date of birth, ID band Patient awake    Reviewed: Allergy & Precautions, H&P , NPO status , Patient's Chart, lab work & pertinent test results, reviewed documented beta blocker date and time   History of Anesthesia Complications Negative for: history of anesthetic complications  Airway Mallampati: III  TM Distance: >3 FB Neck ROM: full    Dental  (+) Missing, Teeth Intact Permanent bridges:   Pulmonary neg pulmonary ROS,    Pulmonary exam normal breath sounds clear to auscultation       Cardiovascular Exercise Tolerance: Good negative cardio ROS Normal cardiovascular exam Rhythm:regular Rate:Normal     Neuro/Psych negative neurological ROS  negative psych ROS   GI/Hepatic negative GI ROS, Neg liver ROS,   Endo/Other  negative endocrine ROS  Renal/GU negative Renal ROS  negative genitourinary   Musculoskeletal   Abdominal   Peds  Hematology  (+) Blood dyscrasia, anemia ,   Anesthesia Other Findings Past Medical History: No date: Anemia     Comment: h/o   Reproductive/Obstetrics negative OB ROS                             Anesthesia Physical Anesthesia Plan  ASA: I  Anesthesia Plan: General   Post-op Pain Management:    Induction:   Airway Management Planned:   Additional Equipment:   Intra-op Plan:   Post-operative Plan:   Informed Consent: I have reviewed the patients History and Physical, chart, labs and discussed the procedure including the risks, benefits and alternatives for the proposed anesthesia with the patient or authorized representative who has indicated his/her understanding and acceptance.   Dental Advisory Given  Plan Discussed with: Anesthesiologist, CRNA and Surgeon  Anesthesia Plan Comments:         Anesthesia Quick Evaluation

## 2016-07-30 NOTE — Anesthesia Postprocedure Evaluation (Signed)
Anesthesia Post Note  Patient: Sharon Santiago  Procedure(s) Performed: Procedure(s) (LRB): REMOVAL GANGLION CYST FOOT (Right)  Patient location during evaluation: PACU Anesthesia Type: General Level of consciousness: awake and alert Pain management: pain level controlled Vital Signs Assessment: post-procedure vital signs reviewed and stable Respiratory status: spontaneous breathing, nonlabored ventilation, respiratory function stable and patient connected to nasal cannula oxygen Cardiovascular status: blood pressure returned to baseline and stable Postop Assessment: no signs of nausea or vomiting Anesthetic complications: no    Last Vitals:  Vitals:   07/30/16 0946 07/30/16 1016  BP: 134/64 133/68  Pulse: (!) 58 64  Resp: 16 16  Temp:      Last Pain:  Vitals:   07/30/16 0926  TempSrc:   PainSc: 0-No pain                 Martha Clan

## 2016-08-05 LAB — SURGICAL PATHOLOGY

## 2017-06-21 ENCOUNTER — Other Ambulatory Visit: Payer: Self-pay | Admitting: Family Medicine

## 2017-06-21 DIAGNOSIS — Z1231 Encounter for screening mammogram for malignant neoplasm of breast: Secondary | ICD-10-CM

## 2017-07-05 ENCOUNTER — Ambulatory Visit
Admission: RE | Admit: 2017-07-05 | Discharge: 2017-07-05 | Disposition: A | Discharge status: Home / Self Care | Payer: BLUE CROSS/BLUE SHIELD | Source: Ambulatory Visit | Attending: Family Medicine | Admitting: Family Medicine

## 2017-07-05 DIAGNOSIS — Z1231 Encounter for screening mammogram for malignant neoplasm of breast: Secondary | ICD-10-CM | POA: Insufficient documentation

## 2020-02-11 ENCOUNTER — Other Ambulatory Visit: Payer: Self-pay | Admitting: Family Medicine

## 2020-02-11 DIAGNOSIS — Z1231 Encounter for screening mammogram for malignant neoplasm of breast: Secondary | ICD-10-CM

## 2020-02-19 ENCOUNTER — Other Ambulatory Visit: Payer: Self-pay

## 2020-02-19 ENCOUNTER — Ambulatory Visit
Admission: RE | Admit: 2020-02-19 | Discharge: 2020-02-19 | Disposition: A | Discharge status: Home / Self Care | Payer: Medicare Other | Source: Ambulatory Visit | Attending: Family Medicine | Admitting: Family Medicine

## 2020-02-19 DIAGNOSIS — Z1231 Encounter for screening mammogram for malignant neoplasm of breast: Secondary | ICD-10-CM | POA: Insufficient documentation

## 2020-02-28 ENCOUNTER — Ambulatory Visit: Payer: Medicare Other | Attending: Family

## 2020-02-28 DIAGNOSIS — Z23 Encounter for immunization: Secondary | ICD-10-CM

## 2020-02-28 NOTE — Progress Notes (Signed)
   Covid-19 Vaccination Clinic  Name:  Sharon Santiago    MRN: [id number] DOB: [birth year]  02/28/2020  [name] was observed post Covid-19 immunization for 15 minutes without incident. She was provided with Vaccine Information Sheet and instruction to access the V-Safe system.   [name] was instructed to call 911 with any severe reactions post vaccine: Marland Kitchen Difficulty breathing  . Swelling of face and throat  . A fast heartbeat  . A bad rash all over body  . Dizziness and weakness   Immunizations Administered    Name Date Dose VIS Date Route   Moderna COVID-19 Vaccine 02/28/2020  2:11 PM 0.5 mL 11/13/2019 Intramuscular   Manufacturer: Moderna   Lot: [id number]   [id number]

## 2020-04-01 ENCOUNTER — Ambulatory Visit: Payer: Medicare Other | Attending: Family

## 2020-04-01 DIAGNOSIS — Z23 Encounter for immunization: Secondary | ICD-10-CM

## 2020-04-01 NOTE — Progress Notes (Signed)
   Covid-19 Vaccination Clinic  Name:  Sharon Santiago    MRN: [id number] DOB: 23-Feb-1956  04/01/2020  Ms. Sharon Santiago was observed post Covid-19 immunization for 15 minutes without incident. She was provided with Vaccine Information Sheet and instruction to access the V-Safe system.   Ms. Sharon Santiago was instructed to call 911 with any severe reactions post vaccine: Marland Kitchen Difficulty breathing  . Swelling of face and throat  . A fast heartbeat  . A bad rash all over body  . Dizziness and weakness   Immunizations Administered    Name Date Dose VIS Date Route   Moderna COVID-19 Vaccine 04/01/2020 10:06 AM 0.5 mL 11/2019 Intramuscular   Manufacturer: Moderna   Lot: [id number]   [id number]

## 2020-11-04 ENCOUNTER — Ambulatory Visit: Payer: Medicare Other | Attending: Internal Medicine

## 2020-11-04 DIAGNOSIS — Z23 Encounter for immunization: Secondary | ICD-10-CM

## 2020-11-04 NOTE — Progress Notes (Signed)
   Covid-19 Vaccination Clinic  Name:  [name]    MRN: [id number] DOB: January 09, 1956  11/04/2020  [name] was observed post Covid-19 immunization for 15 minutes without incident. She was provided with Vaccine Information Sheet and instruction to access the V-Safe system.   [name] was instructed to call 911 with any severe reactions post vaccine: Marland Kitchen Difficulty breathing  . Swelling of face and throat  . A fast heartbeat  . A bad rash all over body  . Dizziness and weakness   Immunizations Administered    No immunizations on file.

## 2021-03-18 ENCOUNTER — Other Ambulatory Visit: Payer: Self-pay | Admitting: Family Medicine

## 2021-03-18 DIAGNOSIS — Z1231 Encounter for screening mammogram for malignant neoplasm of breast: Secondary | ICD-10-CM

## 2021-03-18 DIAGNOSIS — Z780 Asymptomatic menopausal state: Secondary | ICD-10-CM

## 2021-06-23 ENCOUNTER — Ambulatory Visit
Admission: RE | Admit: 2021-06-23 | Discharge: 2021-06-23 | Disposition: A | Discharge status: Home / Self Care | Payer: Medicare (Managed Care) | Source: Ambulatory Visit | Attending: Family Medicine | Admitting: Family Medicine

## 2021-06-23 ENCOUNTER — Other Ambulatory Visit: Payer: Self-pay

## 2021-06-23 DIAGNOSIS — Z1231 Encounter for screening mammogram for malignant neoplasm of breast: Secondary | ICD-10-CM | POA: Diagnosis present

## 2021-11-25 ENCOUNTER — Emergency Department
Admission: EM | Admit: 2021-11-25 | Discharge: 2021-11-25 | Discharge status: Against Medical Advice | Payer: Medicare (Managed Care) | Source: Home / Self Care

## 2021-11-29 NOTE — ED Provider Notes (Signed)
Formatting of this note is different from the original.  [facility]  Emergency Department Provider Note       ED Clinical Impression     Final diagnoses:   Pruritus (Primary)   Nervousness        Impression, ED Course, Assessment and Plan     Impression: Melanie Hudson is a 66 y.o. female with PMH significant for HTN who presents to the emergency department via EMS for nervousness and intermittent rash.  Hypertensive in triage otherwise VSS, nontoxic in appearance.  Overall benign exam.    Ddx includes urticaria vs dermatitis although no rash currently appreciated on exam.  Minimal concern for anaphylaxis.  She does appear very anxious regarding her symptoms.  Could consider infectious process such as UTI secondary to recent urinary frequency reported.  We will check basic labs including TSH, UA/tox, EKG.  Have provided a dose of Atarax for reported diffuse itching and nervousness.    2:02 PM  Patient feels significantly improved after Atarax.  Labs overall unremarkable without any significant cytosis.  TSH normal.  Initial UA significantly contaminated hence had patient repeat and repeat UA clear.  Tox negative.  Plan to discharge with 7 days of antihistamine and H2 blocker for potential recurrent inflammatory process such as dermatitis versus urticaria although not noted on exam.  Hydroxyzine as needed as this would twofold alleviate her itching and anxiety.  PCP follow-up if symptoms persist.  I do not feel that additional work-up nor admission would be beneficial at this time.  Strict reevaluation criteria were discussed and patient verbalized understanding agreeable to discharge     Additional Medical Decision Making     I have reviewed the vital signs and the nursing notes. Labs and radiology results that were available during my care of the patient were independently reviewed by me and considered in my medical decision making.     I directly visualized and independently interpreted  the EKG tracing.   I reviewed the patient's prior medical records.     Portions of this record have been created using L[employer]ist. Dictation errors have been sought, but may not have been identified and corrected.  ____________________________________________       History       Chief Complaint  Nervousness    HPI   Melanie Hudson is a 65 y.o. female with PMH significant for HTN who presents to the emergency department via EMS for nervousness and intermittent rash.  Verbalizes 4 days ago was at home when she suddenly developed a rash to the left side of her neck which was pruritic.  Felt this to be allergenic however took a shower and rash was immediately gone.  Later that evening while watching television developed recurrent similar rash to bilateral medial thighs.  Again attempted to bathe/shower however rash did not improve.  Went to the ER at [facility] however due to the extended wait went home and took Benadryl as staff had previously discussed this when she was triaged.  This made her sleep and in the morning rash was gone.  Went to PCP who provided an EpiPen via prescription, recommended over-the-counter antihistamine and H2 blocker, Benadryl.  She has not started the antihistamine and H2 blocker as she states she got confused upon recommendations.  Overall felt better the next day however last night had recurrent rash to the neck.  She states she feels very nervous.  Feels that something is wrong.  Itchy all over and mouth is  very dry.  States that she felt as if her lips were swollen however this is dissipated.  Short of breath however states she feels that she is so anxious about her symptoms it may be precipitating the shortness of breath.  No recent fever, chest pain, shortness of breath, headache, abdominal pain,n/v/d, dysuria, tongue swelling, stridor.  Patient has had urinary frequency and would like her urine checked.  Denies any recreational drug or excessive alcohol use.  No new  medications or new over-the-counter supplements.  No changes in foods, soaps, lotions, laundry detergent, etc.    Of note, per review of chart, patient self discontinued previously prescribed amlodipine.  She is hypertensive here however states that she has been checking her blood pressures at home and they have been below [id number] systolic and she has been monitoring.    Past Medical History:   Diagnosis Date   ? Hypertension      There is no problem list on file for this patient.    No past surgical history on file.    No current facility-administered medications for this encounter.    Current Outpatient Medications:   ?  cetirizine (ZYRTEC) 10 MG tablet, Take 1 tablet (10 mg total) by mouth daily for 7 days., Disp: 7 tablet, Rfl: 0  ?  famotidine (PEPCID) 20 MG tablet, Take 1 tablet (20 mg total) by mouth Two (2) times a day for 7 days., Disp: 14 tablet, Rfl: 0  ?  hydrOXYzine (ATARAX) 25 MG tablet, Take 1 tablet (25 mg total) by mouth Three (3) times a day as needed for itching or anxiety., Disp: 20 tablet, Rfl: 0    Allergies  Patient has no known allergies.    History reviewed. No pertinent family history.    Social History  Social History     Tobacco Use   ? Smoking status: Former     Types: Cigarettes   ? Smokeless tobacco: Never   Vaping Use   ? Vaping Use: Never used   Substance Use Topics   ? Alcohol use: Not Currently   ? Drug use: Not Currently     Review of Systems    Constitutional: Negative for fever.  Eyes: Negative for visual changes.  ENT: Negative for sore throat.  Cardiovascular: Negative for chest pain.  Respiratory: Positive for shortness of breath.  Gastrointestinal: Negative for abdominal pain, vomiting or diarrhea.  Genitourinary: Positive for urinary frequency.  Negative for dysuria or hematuria.  Musculoskeletal: Negative for back pain.  Skin: Negative for rash.  Neurological: Positive for nervousness.  Negative for headaches, focal weakness or numbness.    All other systems have been reviewed  and are negative except as otherwise documented.     Physical Exam     This provider entered the patient's room: Yes:    ? If this provider did not enter the room, a comprehensive physical exam was not able to be performed due to increased infection risk to themselves, other providers, staff and other patients), as well as to conserve personal protective equipment (PPE) utilization during the COVID-19 pandemic.    ? If this provider did enter the patient room, the following was PPE worn: Surgical mask, eye protection and gloves    ED Triage Vitals [11/29/21 1035]   Enc Vitals Group      BP 155/114      Heart Rate 103      SpO2 Pulse       Resp 22  Temp 36.9 C (98.5 F)      Temp Source Oral      SpO2 97 %     Constitutional: Alert and oriented. Well appearing and in no distress.  Eyes: Conjunctivae are normal.  ENT       Head: Normocephalic and atraumatic.       Nose: No congestion.       Mouth/Throat: Mucous membranes are moist.  Oropharynx patent without any noted inflammation.  No inflammation to the lips.       Neck: No stridor.  Hematological/Lymphatic/Immunilogical: No cervical lymphadenopathy.  Cardiovascular: Normal rate, regular rhythm. Normal and symmetric distal pulses are present in all extremities.  Bilateral DPs 2+.  No significant lower extremity edema noted.  Respiratory: Normal respiratory effort. Breath sounds are normal in all lobes.  Gastrointestinal: Soft nondistended and nontender.   Neurologic: Normal speech and language. No gross focal neurologic deficits are appreciated.  GCS 15.  Skin: Skin is warm, dry and intact. No rash noted.  Psychiatric: Mood and affect are anxious appearing. Speech and behavior are normal.     EKG     Normal rate, regular rhythm, 95bpm  No axis deviation  Normal intervals  No significant ST elevation or depression     Radiology     No orders to display      Procedures     n/a      Joseph Pierini, FNP  11/29/21 1630    Electronically signed by Joseph Pierini, [place] at 11/29/2021  4:30 PM EST

## 2021-11-29 NOTE — ED Notes (Signed)
Formatting of this note might be different from the original.  Bed: 06  Expected date:   Expected time:   Means of arrival:   Comments:  ems  Electronically signed by Desma Maxim, RN at 11/29/2021 10:33 AM EST

## 2021-11-29 NOTE — Unmapped (Signed)
Formatting of this note might be different from the original.  [facility] test result follow-up note    November 30, 2021 4:49 PM     I was contacted by the ED resource nurse Reola Mosher) with regard to [facility]. She was seen in the [facility] Emergency Department on 12/18 at which time a clean catch urine culture was done which has returned positive for 50-100 K CFU Escherichia coli, 10-50 K CFU mixed urogenital flora.    I have reviewed the patient's chart from this ED visit.She presented with an urticarial rash and nervousness.  It is documented that the patient denied dysuria, although it is documented that she "has had urinary frequency and would like her urine checked."  Initial UA was significantly contaminated hence the patient repeated her urinalysis, and in my review of it, it was completely negative.  It appears that the urine culture was grown off the initial highly contaminated UA.    Given the significant contamination on UA and bacterial colony count < 100,000 CFU/mL, clinical picture is suggestive of a contaminant and the patient may not require antibiotic treatment.  In addition, the repeat UA is entirely clean with no white cells, no bacteria, and no other nitrites, protein, glucose, ketones of any kind noted.    Patient does not appear to require a telephone call at this time, as it appears that this was a very discussed with the patient during her ED visit.        Electronically signed by Adolph Pollack, MD at 11/30/2021  4:53 PM EST

## 2021-11-29 NOTE — ED Notes (Signed)
Formatting of this note might be different from the original.  .Patient rounding complete, call bell in reach, bed locked and in lowest position, patient belongings at bedside and within reach of patient.  Patient updated on plan of care.      Pt paced on cardiac monitor, continuous pulse oximeter and continuous blood pressure monitoring.     Pt assistance to bathroom and back to the room.  Electronically signed by Alexia Freestone, CNA at 11/29/2021 10:54 AM EST

## 2021-11-29 NOTE — Progress Notes (Signed)
Formatting of this note might be different from the original.  Per Dr. Maylene Roes, this result is due to a dirty sample. No further actions needed.  Electronically signed by Augusto Garbe, RN at 11/30/2021  5:01 PM EST

## 2021-11-29 NOTE — ED Notes (Signed)
Formatting of this note might be different from the original.  Pt in NAD. I went over medications in detail. Pt leaving with s/o.  Electronically signed by Manfred Arch, RN at 11/29/2021  2:21 PM EST

## 2021-11-29 NOTE — Unmapped (Signed)
Formatting of this note might be different from the original.  Pt coming from home via [facility]. Pt stating she called 911 because she started feeling shaky and dry mouthed again like yesterday.  Pt stating she was feeling shaky and "nervouse."  Pt stopped with her amlodipine and just started back yesterday. Pt thinking she may have had an allergic reaction on Wednesday which she went to the ED for. Pt shaking at times. +DTS  Electronically signed by Augusto Garbe, RN at 11/29/2021 10:41 AM EST

## 2021-11-29 NOTE — Progress Notes (Signed)
Formatting of this note might be different from the original.  Urine Culture  Order: [id number] - Reflex for Order [id number]  Status: Final result    Visible to patient: No (scheduled for 11/30/2021 1:49 PM)    Specimen Information:Clean Catch; Urine  0 Result Notes  Urine Culture, Comprehensive  50,000 to 100,000 CFU/mL Escherichia coliAbnormal     EM[facility] notified of positive urine culture. Patient's chart was reviewed and she was not sent home on antibiotics.     Electronically signed by Augusto Garbe, RN at 11/30/2021  3:58 PM EST

## 2022-03-25 NOTE — Progress Notes (Signed)
Formatting of this note is different from the original.  Telemedicine video encounter documentation    This video encounter was conducted with the patient's (or proxy's) verbal consent via secure, interactive audio and video telecommunications while in clinic/office/hospital.    The patient (or proxy) was instructed to have this encounter in a suitably private space and to only have persons present to whom they give permission to participate. In addition, patient identity was confirmed by use of name plus two identifiers.    Chief Complaint:     Chief Complaint   Patient presents with   ? Hypertension     Subjective:     Melanie Hudson is a 66 y.o. female established patient. Video visit today for:    HPI  Hypertension Follow Up  Patient is here to follow up of hypertension propranolol 20 mg po bid  Feels this problems is controlled. She has not been checking her blood pressure trends at home.  Taking medications as prescribed? Yes  Medication side effects? No  Denies: chest pain on exertion, chest pain at rest, palpitations, edema and syncope   Home blood pressure readings are at goal .   Adherent to a low-salt diet? most of the time  Exercise: 0 per week    The 10-year ASCVD risk score (Arnett DK, et al., 2019) is: 11.1%    Values used to calculate the score:      Age: 49 years      Sex: Female      Is Non-Hispanic African American: Yes      Diabetic: No      Tobacco smoker: No      Systolic Blood Pressure: [id number] mmHg      Is BP treated: Yes      HDL Cholesterol: 44 mg/dL      Total Cholesterol: 200 mg/dL    She has deferred on taking statins.  Social History     Tobacco Use   Smoking Status Never   Smokeless Tobacco Never     BP Readings from Last 3 Encounters:   01/05/22 132/80   01/05/22 132/80   12/17/21 121/78     Lab Results   Component Value Date    GFR 59 01/01/2021     Lab Results   Component Value Date    CREATININE 1.0 01/01/2021     [facility] Microalbumin/Creatinine Ratio   Date Value Ref Range Status    05/27/2017 <30 <30 mg/g Final     Wt Readings from Last 5 Encounters:   03/25/22 89.8 kg (198 lb)   01/05/22 91.8 kg (202 lb 6.1 oz)   01/05/22 91.8 kg (202 lb 6.1 oz)   12/17/21 90.7 kg (199 lb 15.3 oz)   12/08/21 91.2 kg (201 lb)     Patient Active Problem List   Diagnosis   ? Diverticulitis   ? Hypercholesterolemia   ? Vitamin D insufficiency   ? Brain mass   ? Ganglion cyst   ? Essential hypertension   ? Hyperlipidemia   ? Anemia   ? Anxiety     Outpatient Medications Prior to Visit   Medication Sig Dispense Refill   ? APPLE CIDER VINEGAR ORAL Take by mouth     ? biotin 1 mg tablet Take 1,000 mcg by mouth once daily     ? cetirizine (ZYRTEC) 10 MG tablet TAKE 1 TABLET BY MOUTH EVERY DAY 30 tablet 11   ? cholecalciferol (VITAMIN D3) 1000 unit tablet Take by mouth once daily     ?  FLAXSEED ORAL Take by mouth     ? multivitamin tablet Take 1 tablet by mouth once daily     ? omeprazole (PRILOSEC) 20 MG DR capsule Take 1 capsule by mouth 2 (two) times daily     ? UNABLE TO FIND once daily otc monolaurin pellets     ? propranoloL (INDERAL) 20 MG tablet Take 1 tablet (20 mg total) by mouth 2 (two) times daily 60 tablet 11   ? triamcinolone 0.1 % cream Apply topically 2 (two) times daily Apply to affected areas 2 times per day as need (may use for 7-10 days, then take 7 days off, may repeat as needed 454 g 0     No facility-administered medications prior to visit.     ROS   As per hpi  Objective:     Vitals as reported by patient:  Vitals:    03/25/22 1316   Weight: 89.8 kg (198 lb)   Height: 168.9 cm (5' 6.5")   PainSc: 0-No pain     Body mass index is 31.48 kg/m.    Constitutional: alert, interactive with provider, cooperative, in no distress  Mental status: oriented x 3, good historian, appropriate mood and behavior, thought content appears normal  Respiratory: no respiratory distress, no audible wheezing  Chest wall exam: no chest wall pain reported    Assessment/Plan:     Diagnoses and all orders for this  visit:    Essential hypertension    -     propranoloL (INDERAL) 20 MG tablet; Take 1 tablet (20 mg total) by mouth 2 (two) times daily for HTN and , tachycardia,     well controlled  -  Continue current medication(s)  -  Encouraged dietary sodium restriction/DASH diet  -  Recommended exercise  -  Recommended regular aerobic exercise.  -  Recommend home blood pressure monitoring, to bring results in next visit  -  Discussed need for and importance of continued work on weight loss  -  Discussed need for med compliance  -  Reviewed risks of HTN, principles of treatment and consequences of untreated HTN  - Goal blood pressure less than 130/80  -  Follow up in 6 month for BP recheck and follow up    This visit was coded based on medical decision making (MDM).    Portions of this note were generated with voice recognition software (DragonMedicalOne dictation software / speech recognition software) and may contain unintended transcription errors as interpreted by the software. I apologize for any  typographical errors that were not detected and corrected.           No follow-ups on file.    Future Appointments    This patient does not currently have any appointments scheduled.      Patient Instructions   Patient Education  DASH Diet: Care Instructions  Your Care Instructions    The DASH diet is an eating plan that can help lower your blood pressure. DASH stands for Dietary Approaches to Stop Hypertension. Hypertension is high blood pressure.  The DASH diet focuses on eating foods that are high in calcium, potassium, and magnesium. These nutrients can lower blood pressure. The foods that are highest in these nutrients are fruits, vegetables, low-fat dairy products, nuts, seeds, and legumes. But taking calcium, potassium, and magnesium supplements instead of eating foods that are high in those nutrients does not have the same effect. The DASH diet also includes whole grains, fish, and poultry.  The DASH diet  is one of  several lifestyle changes your doctor may recommend to lower your high blood pressure. Your doctor may also want you to decrease the amount of sodium in your diet. Lowering sodium while following the DASH diet can lower blood pressure even further than just the DASH diet alone.  Follow-up care is a key part of your treatment and safety. Be sure to make and go to all appointments, and call your doctor if you are having problems. It's also a good idea to know your test results and keep a list of the medicines you take.  How can you care for yourself at home?  Following the DASH diet  ? Eat 4 to 5 servings of fruit each day. A serving is 1 medium-sized piece of fruit,  cup chopped or canned fruit, 1/4 cup dried fruit, or 4 ounces ( cup) of fruit juice. Choose fruit more often than fruit juice.  ? Eat 4 to 5 servings of vegetables each day. A serving is 1 cup of lettuce or raw leafy vegetables,  cup of chopped or cooked vegetables, or 4 ounces ( cup) of vegetable juice. Choose vegetables more often than vegetable juice.  ? Get 2 to 3 servings of low-fat and fat-free dairy each day. A serving is 8 ounces of milk, 1 cup of yogurt, or 1  ounces of cheese.  ? Eat 6 to 8 servings of grains each day. A serving is 1 slice of bread, 1 ounce of dry cereal, or  cup of cooked rice, pasta, or cooked cereal. Try to choose whole-grain products as much as possible.  ? Limit lean meat, poultry, and fish to 2 servings each day. A serving is 3 ounces, about the size of a deck of cards.  ? Eat 4 to 5 servings of nuts, seeds, and legumes (cooked dried beans, lentils, and split peas) each week. A serving is 1/3 cup of nuts, 2 tablespoons of seeds, or  cup of cooked beans or peas.  ? Limit fats and oils to 2 to 3 servings each day. A serving is 1 teaspoon of vegetable oil or 2 tablespoons of salad dressing.  ? Limit sweets and added sugars to 5 servings or less a week. A serving is 1 tablespoon jelly or jam,  cup sorbet, or 1 cup of  lemonade.  ? Eat less than 2,300 milligrams (mg) of sodium a day. If you limit your sodium to 1,500 mg a day, you can lower your blood pressure even more.  ? Be aware that all of these are the suggested number of servings for people who eat 1,800 to 2,000 calories a day. Your recommended number of servings may be different if you need more or fewer calories.  Tips for success  ? Start small. Do not try to make dramatic changes to your diet all at once. You might feel that you are missing out on your favorite foods and then be more likely to not follow the plan. Make small changes, and stick with them. Once those changes become habit, add a few more changes.  ? Try some of the following:  ? Make it a goal to eat a fruit or vegetable at every meal and at snacks. This will make it easy to get the recommended amount of fruits and vegetables each day.  ? Try yogurt topped with fruit and nuts for a snack or healthy dessert.  ? Add lettuce, tomato, cucumber, and onion to sandwiches.  ? Combine a Magazine features editor  crust with low-fat mozzarella cheese and lots of vegetable toppings. Try using tomatoes, squash, spinach, broccoli, carrots, cauliflower, and onions.  ? Have a variety of cut-up vegetables with a low-fat dip as an appetizer instead of chips and dip.  ? Sprinkle sunflower seeds or chopped almonds over salads. Or try adding chopped walnuts or almonds to cooked vegetables.  ? Try some vegetarian meals using beans and peas. Add garbanzo or kidney beans to salads. Make burritos and tacos with mashed pinto beans or black beans.  Where can you learn more?  Log in to your [facility] MyChart account at [email or web address] and click on top menu option "Health" then select "Search Medical Library".  Enter [phone] in the search box and click the magnify glass to learn more about "DASH Diet: Care Instructions."  Current as of: August 19, 2021               Content Version: 13.5   2006-2022 [employer].   Care  instructions adapted under license by your healthcare professional. If you have questions about a medical condition or this instruction, always ask your healthcare professional. [place] any warranty or liability for your use of this information.          An after visit summary was provided for the patient either in written format (printed) or through [place].    Electronically signed by Atilano Median, MD at [id number]  1:58 PM EDT

## 2022-07-08 NOTE — Telephone Encounter (Signed)
-----   Message from Gardiner Barefoot, [place] sent at 07/08/2022 11:18 AM EDT -----  Subject: Appointment Request    Reason for Call: New Patient/New to Provider Appointment needed: New   Patient Request Appointment    QUESTIONS    Reason for appointment request? No appointments available during search     Additional Information for Provider? Pt called and stated that she was a   prior pt of Dr. Cecille Amsterdam. Pt states that she moved away in 2016 but has   came back and would like to re-establish with Dr. Sherryll Burger. No availabilities   on our end to schedule the pt. Pt would like a call back regarding if she   can re-establish.   ---------------------------------------------------------------------------  --------------  Melanie Hudson INFO  [phone]; OK to leave message on voicemail  ---------------------------------------------------------------------------  --------------  SCRIPT ANSWERS

## 2022-10-15 NOTE — Telephone Encounter (Signed)
-----   Message from [place] sent at 10/15/2022 11:58 AM EDT -----  Subject: Message to Provider    QUESTIONS  Information for Provider? ###She has since scheduled with Valeda Malm,   CNP for 04/24, but if possible, she would like to re-establish Dr.   Driscilla Moats, please let her know. ###Pt was established with DR   [facility] but moved out of the area; she was last seen in 2016. Pt just   moved back and wants to re-establish with DR [facility].   ---------------------------------------------------------------------------  --------------  Rod Can INFO  [phone]; OK to leave message on voicemail  ---------------------------------------------------------------------------  --------------  SCRIPT ANSWERS  Relationship to Patient? Self

## 2023-01-03 NOTE — Telephone Encounter (Signed)
PSR reached out to patient to see if she wanted to establish care with MD - states she is okay with seeing NP but id also seeing another doctor to see if she likes her better. We did keep new patient appointment with Melanie Hudson.

## 2023-01-13 ENCOUNTER — Encounter

## 2023-01-20 IMAGING — MG MM DIGITAL SCREENING BILAT W/ TOMO AND CAD
8 series · 8 of 24 positions shown · non-contrast
Comparison: Previous exam(s).

CLINICAL DATA: Screening.

EXAM:
DIGITAL SCREENING BILATERAL MAMMOGRAM WITH TOMOSYNTHESIS AND CAD
TECHNIQUE: Bilateral screening digital craniocaudal and mediolateral oblique
mammograms were obtained. Bilateral screening digital breast
tomosynthesis was performed. The images were evaluated with
computer-aided detection.

[R MLO synth-2D]
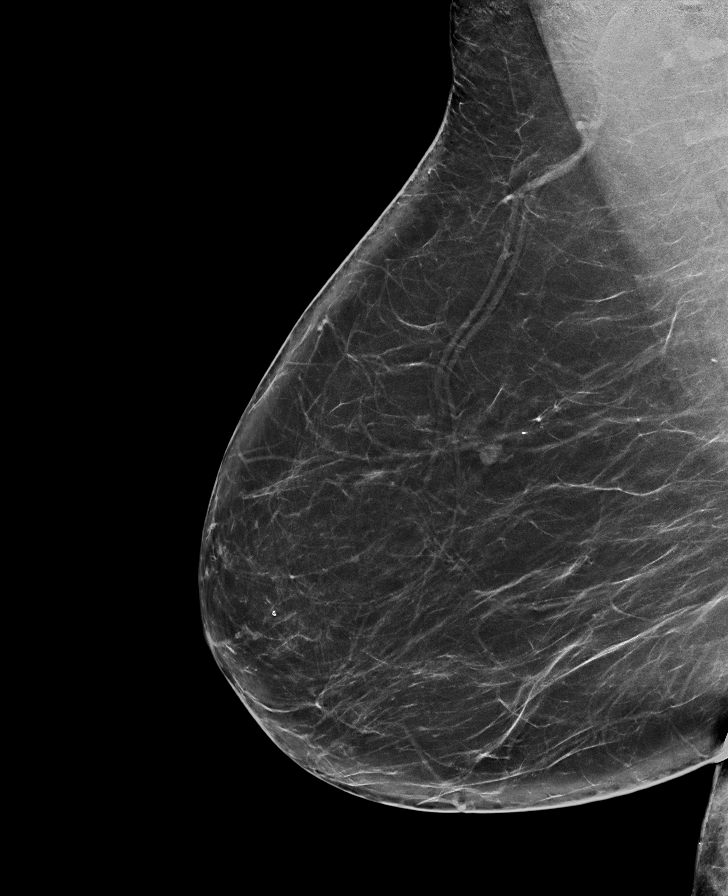

[R CC synth-2D]
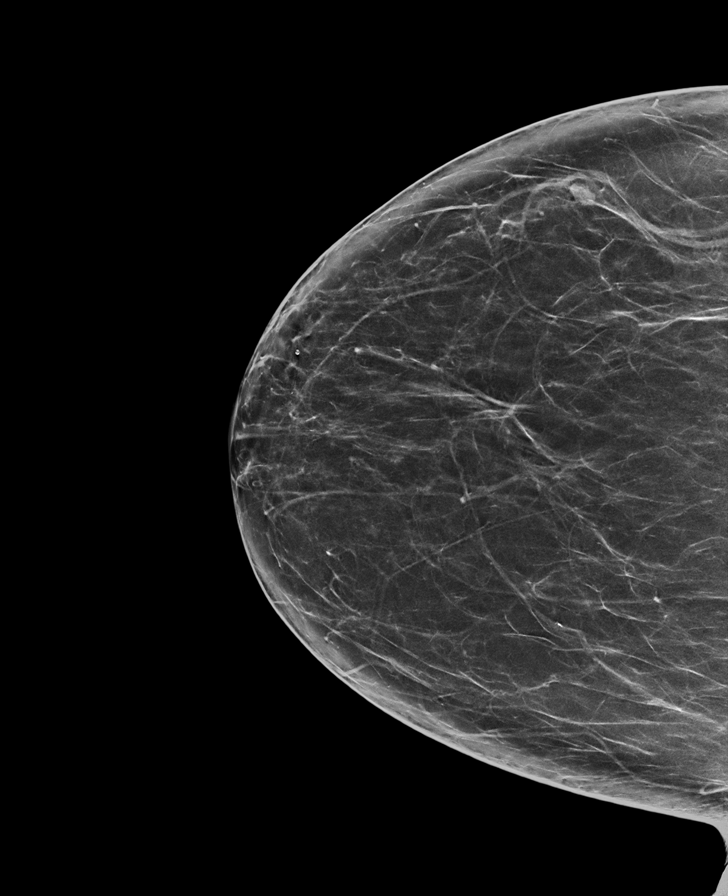

[L MLO synth-2D]
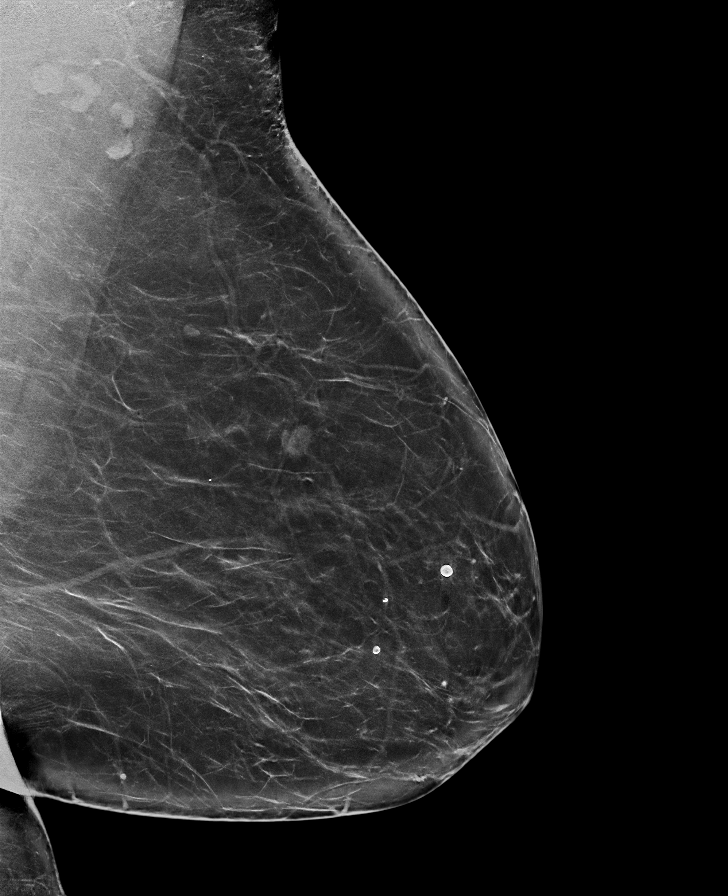

[L CC synth-2D]
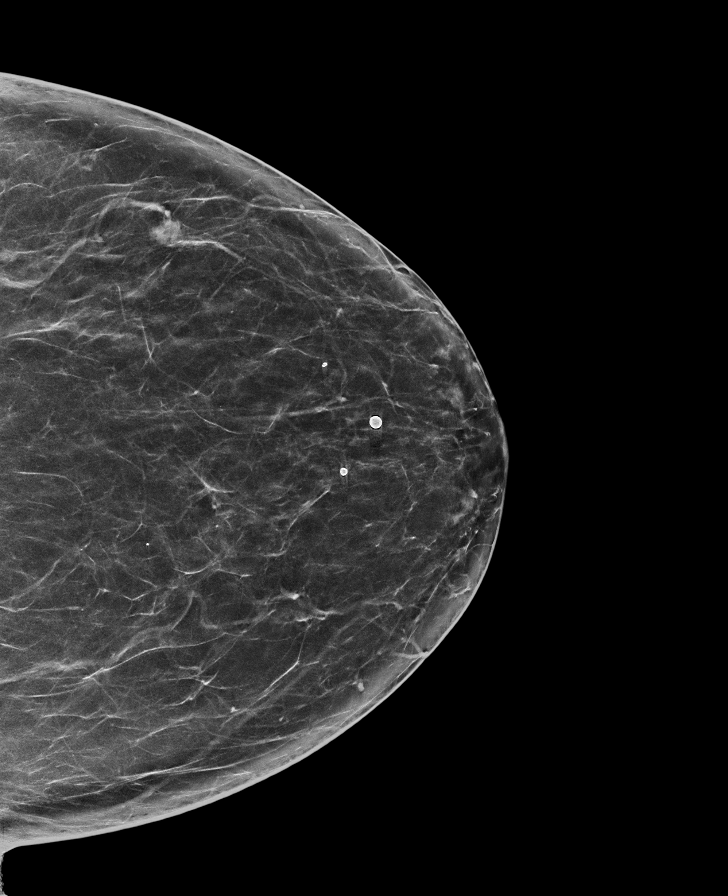

[L CC tomo · tomo slice 37/74.0]
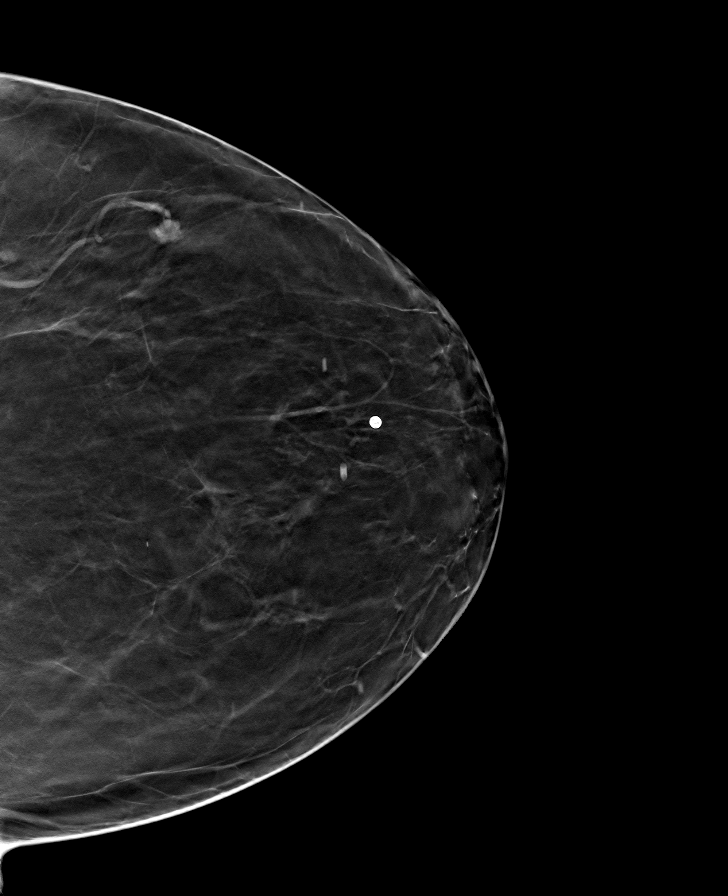

[L MLO tomo · tomo slice 47/92.0]
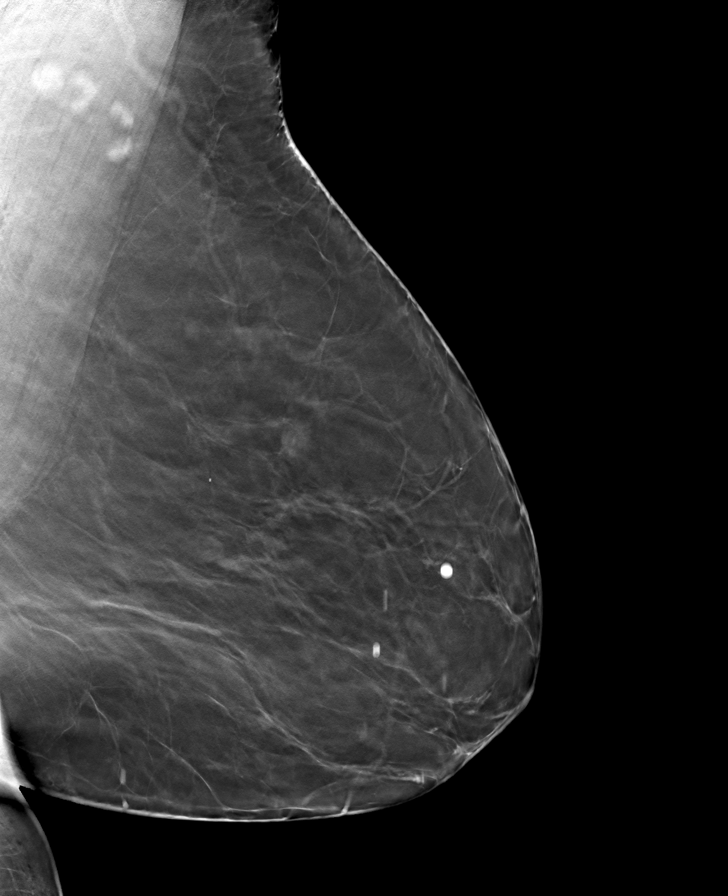

[R CC tomo · tomo slice 37/74.0]
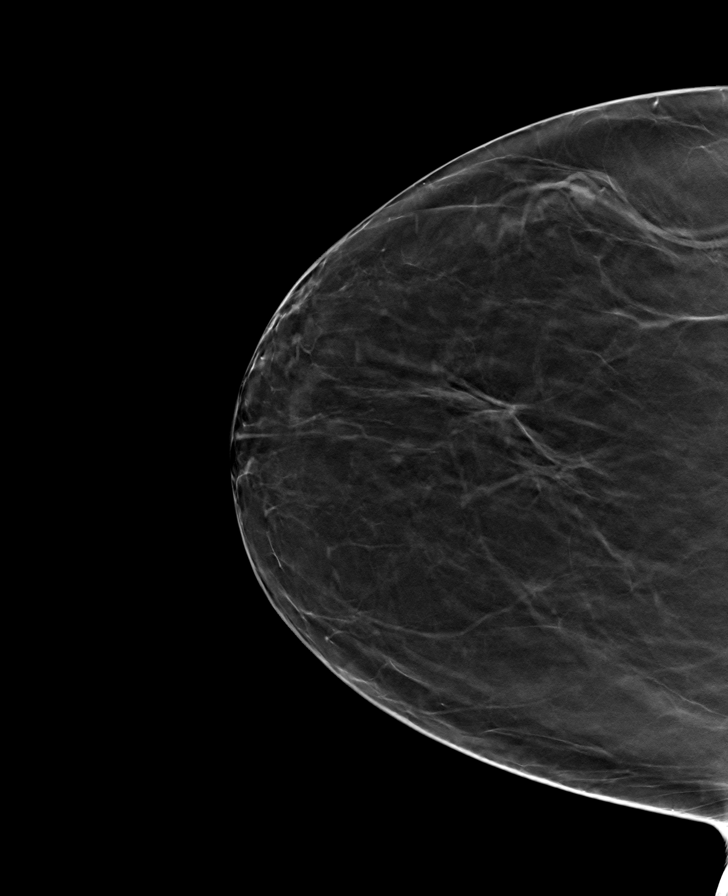

[R MLO tomo · tomo slice 43/86.0]
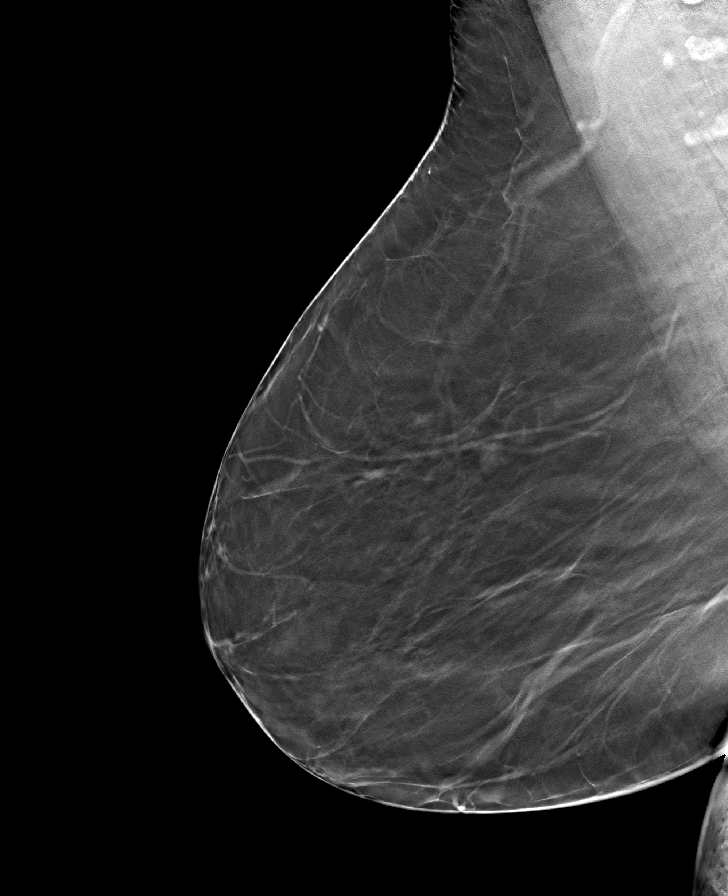

[8 of 24 positions shown; findings below may reference images not displayed]

ACR Breast Density Category b: There are scattered areas of
fibroglandular density.
FINDINGS: There are no findings suspicious for malignancy.
IMPRESSION: No mammographic evidence of malignancy. A result letter of this
screening mammogram will be mailed directly to the patient.

RECOMMENDATION:
Screening mammogram in one year. (Code:51-O-LD2)

BI-RADS CATEGORY  1: Negative.

## 2023-01-25 ENCOUNTER — Inpatient Hospital Stay
Admit: 2023-01-25 | Discharge: 2023-01-25 | Disposition: A | Discharge status: Home / Self Care | Payer: MEDICARE | Attending: Emergency Medicine

## 2023-01-25 ENCOUNTER — Emergency Department: Admit: 2023-01-25 | Payer: MEDICARE

## 2023-01-25 DIAGNOSIS — I10 Essential (primary) hypertension: Secondary | ICD-10-CM

## 2023-01-25 LAB — CBC WITH AUTO DIFFERENTIAL
Absolute Immature Granulocyte: 0 10*3/uL (ref 0.00–0.04)
Basophils %: 1 % (ref 0–1)
Basophils Absolute: 0.1 10*3/uL (ref 0.0–0.1)
Eosinophils %: 3 % (ref 0–7)
Eosinophils Absolute: 0.2 10*3/uL (ref 0.0–0.4)
Hematocrit: 39.9 % (ref 35.0–47.0)
Hemoglobin: 14.4 g/dL (ref 11.5–16.0)
Immature Granulocytes %: 0 % (ref 0.0–0.5)
Lymphocytes %: 33 % (ref 12–49)
Lymphocytes Absolute: 1.9 10*3/uL (ref 0.8–3.5)
MCH: 31.5 PG (ref 26.0–34.0)
MCHC: 36.1 g/dL (ref 30.0–36.5)
MCV: 87.3 FL (ref 80.0–99.0)
MPV: 11.1 FL (ref 8.9–12.9)
Monocytes %: 6 % (ref 5–13)
Monocytes Absolute: 0.3 10*3/uL (ref 0.0–1.0)
Neutrophils %: 57 % (ref 32–75)
Neutrophils Absolute: 3.4 10*3/uL (ref 1.8–8.0)
Nucleated RBCs: 0 PER 100 WBC
Platelets: 264 10*3/uL (ref 150–400)
RBC: 4.57 M/uL (ref 3.80–5.20)
RDW: 11.9 % (ref 11.5–14.5)
WBC: 5.8 10*3/uL (ref 3.6–11.0)
nRBC: 0 10*3/uL (ref 0.00–0.01)

## 2023-01-25 LAB — EKG 12-LEAD
Atrial Rate: 118 {beats}/min
P Axis: 66 degrees
P-R Interval: 138 ms
Q-T Interval: 320 ms
QRS Duration: 76 ms
QTc Calculation (Bazett): 448 ms
R Axis: 52 degrees
T Axis: 65 degrees
Ventricular Rate: 118 {beats}/min

## 2023-01-25 LAB — COMPREHENSIVE METABOLIC PANEL
ALT: 25 U/L (ref 12–78)
AST: 16 U/L (ref 15–37)
Albumin/Globulin Ratio: 1 — ABNORMAL LOW (ref 1.1–2.2)
Albumin: 4.2 g/dL (ref 3.5–5.0)
Alk Phosphatase: 102 U/L (ref 45–117)
Anion Gap: 9 mmol/L (ref 5–15)
BUN: 14 MG/DL (ref 6–20)
Bun/Cre Ratio: 13 (ref 12–20)
CO2: 26 mmol/L (ref 21–32)
Calcium: 10.3 MG/DL — ABNORMAL HIGH (ref 8.5–10.1)
Chloride: 104 mmol/L (ref 97–108)
Creatinine: 1.07 MG/DL — ABNORMAL HIGH (ref 0.55–1.02)
Est, Glom Filt Rate: 57 mL/min/{1.73_m2} — ABNORMAL LOW (ref >60)
Globulin: 4.3 g/dL — ABNORMAL HIGH (ref 2.0–4.0)
Glucose: 125 mg/dL — ABNORMAL HIGH (ref 65–100)
Potassium: 3.7 mmol/L (ref 3.5–5.1)
Sodium: 139 mmol/L (ref 136–145)
Total Bilirubin: 0.6 MG/DL (ref 0.2–1.0)
Total Protein: 8.5 g/dL — ABNORMAL HIGH (ref 6.4–8.2)

## 2023-01-25 LAB — EXTRA TUBES HOLD

## 2023-01-25 LAB — TROPONIN: Troponin, High Sensitivity: 9 ng/L (ref 0–51)

## 2023-01-25 LAB — URINALYSIS WITH MICROSCOPIC
BACTERIA, URINE: NEGATIVE /hpf
Bilirubin Urine: NEGATIVE
Blood, Urine: NEGATIVE
Glucose, UA: NEGATIVE mg/dL
Ketones, Urine: NEGATIVE mg/dL
Nitrite, Urine: NEGATIVE
Protein, UA: NEGATIVE mg/dL
Specific Gravity, UA: 1.007 (ref 1.003–1.030)
Urobilinogen, Urine: 0.2 EU/dL (ref 0.2–1.0)
pH, Urine: >8.5 — ABNORMAL HIGH (ref 5.0–8.0)

## 2023-01-25 MED ORDER — CLONIDINE HCL 0.1 MG PO TABS
0.1 | ORAL | Status: AC
Start: 2023-01-25 — End: 2023-01-25
  Administered 2023-01-25: 12:00:00 0.2 mg via ORAL

## 2023-01-25 MED FILL — CLONIDINE HCL 0.1 MG PO TABS: 0.1 MG | ORAL | Qty: 2

## 2023-01-25 NOTE — ED Provider Notes (Signed)
[facility] EMERGENCY DEP  EMERGENCY DEPARTMENT ENCOUNTER      Pt Name: Melanie Hudson  MRN: [id number]  [place] 1956/02/02  Date of evaluation: 01/25/2023  Provider: Tyrone Sage, MD    CHIEF COMPLAINT       Chief Complaint   Patient presents with    Tingling         HISTORY OF PRESENT ILLNESS   (Location/Symptom, Timing/Onset, Context/Setting, Quality, Duration, Modifying Factors, Severity)  Note limiting factors.   67 year old female with diffuse tingling all over her body.  Mainly of the head and ears and torso.  No motor or focal weakness of arms legs or face.  Patient states that her blood pressures also been high and she has had some right flank pain ongoing and being evaluated by her primary.  She is otherwise stable in the ER.  Systolic blood pressure around 200 in triage 180 at the time of my evaluation.    The history is provided by the patient.   Neurologic Problem  Primary symptoms include no focal weakness, no loss of sensation, no loss of balance, no speech change, no memory loss, no movement disorder, no dizziness. This is a new problem. The current episode started 1 to 2 hours ago. The problem has been gradually improving. There was no focality noted. There has been no fever. Pertinent negatives include no shortness of breath, no chest pain, no vomiting, no confusion and no headaches.         Review of External Medical Records:     Nursing Notes were reviewed.    REVIEW OF SYSTEMS    (2-9 systems for level 4, 10 or more for level 5)     Review of Systems   Constitutional:  Negative for activity change, appetite change, chills and diaphoresis.   HENT:  Negative for congestion, ear pain, facial swelling and sore throat.    Eyes:  Negative for pain, discharge and visual disturbance.   Respiratory:  Negative for cough, chest tightness and shortness of breath.    Cardiovascular:  Negative for chest pain and palpitations.   Gastrointestinal:  Negative for abdominal pain, diarrhea, nausea and  vomiting.   Endocrine: Negative for cold intolerance, heat intolerance, polydipsia and polyphagia.   Genitourinary:  Negative for difficulty urinating, dysuria, frequency, hematuria, urgency and vaginal discharge.   Musculoskeletal:  Negative for arthralgias, back pain, joint swelling and myalgias.   Skin:  Negative for rash and wound.   Neurological:  Negative for dizziness, speech change, focal weakness, syncope, facial asymmetry, headaches and loss of balance.   Psychiatric/Behavioral:  Negative for agitation, behavioral problems, confusion and memory loss.    All other systems reviewed and are negative.      Except as noted above the remainder of the review of systems was reviewed and negative.       PAST MEDICAL HISTORY   No past medical history on file.      SURGICAL HISTORY     No past surgical history on file.      CURRENT MEDICATIONS       Previous Medications    No medications on file       ALLERGIES     Patient has no known allergies.    FAMILY HISTORY     No family history on file.       SOCIAL HISTORY       Social History     Socioeconomic History    Marital status: Married  PHYSICAL EXAM    (up to 7 for level 4, 8 or more for level 5)     ED Triage Vitals [01/25/23 0221]   BP Temp Temp Source Pulse Respirations SpO2 Height Weight - Scale   (!) 202/99 97.5 F (36.4 C) Oral (!) 103 18 100 % -- 92.1 kg (203 lb)       There is no height or weight on file to calculate BMI.    Physical Exam  Vitals and nursing note reviewed.   Constitutional:       Appearance: She is normal weight.   HENT:      Head: Normocephalic and atraumatic.   Eyes:      Extraocular Movements: Extraocular movements intact.      Conjunctiva/sclera: Conjunctivae normal.      Pupils: Pupils are equal, round, and reactive to light.   Cardiovascular:      Rate and Rhythm: Normal rate and regular rhythm.      Pulses: Normal pulses.      Heart sounds: Normal heart sounds. No murmur heard.  Pulmonary:      Effort: Pulmonary effort is  normal. No respiratory distress.      Breath sounds: No stridor. No wheezing.   Abdominal:      General: Abdomen is flat. Bowel sounds are normal. There is no distension.      Palpations: Abdomen is soft.      Tenderness: There is no abdominal tenderness.   Musculoskeletal:         General: Normal range of motion.      Cervical back: Normal range of motion. No rigidity.   Lymphadenopathy:      Cervical: No cervical adenopathy.   Skin:     General: Skin is warm and dry.   Neurological:      General: No focal deficit present.      Mental Status: She is alert and oriented to person, place, and time. Mental status is at baseline.   Psychiatric:         Mood and Affect: Mood normal.         Behavior: Behavior normal.         DIAGNOSTIC RESULTS     EKG: All EKG's are interpreted by the Emergency Department Physician who either signs or Co-signs this chart in the absence of a cardiologist.        RADIOLOGY:   Non-plain film images such as CT, Ultrasound and MRI are read by the radiologist. Plain radiographic images are visualized and preliminarily interpreted by the emergency physician with the below findings:        Interpretation per the Radiologist below, if available at the time of this note:    CT Head W/O Contrast   Final Result   No acute intracranial process. Indeterminate 7 mm intraventricular mass; further   evaluation with nonemergent contrast enhanced brain MRI is recommended.                 LABS:  Labs Reviewed   COMPREHENSIVE METABOLIC PANEL - Abnormal; Notable for the following components:       Result Value    Glucose 125 (*)     Creatinine 1.07 (*)     Est, Glom Filt Rate 57 (*)     Calcium 10.3 (*)     Total Protein 8.5 (*)     Globulin 4.3 (*)     Albumin/Globulin Ratio 1.0 (*)     All other components within normal  limits   URINALYSIS WITH MICROSCOPIC - Abnormal; Notable for the following components:    pH, Urine >8.5 (*)     Leukocyte Esterase, Urine SMALL (*)     Epithelial Cells UA MANY (*)     All  other components within normal limits   CBC WITH AUTO DIFFERENTIAL   EXTRA TUBES HOLD   TROPONIN   EXTRA TUBES HOLD       All other labs were within normal range or not returned as of this dictation.    EMERGENCY DEPARTMENT COURSE and DIFFERENTIAL DIAGNOSIS/MDM:   Vitals:    Vitals:    01/25/23 0221 01/25/23 0640   BP: (!) 202/99 (!) 185/100   Pulse: (!) 103    Resp: 18    Temp: 97.5 F (36.4 C)    TempSrc: Oral    SpO2: 100%    Weight: 92.1 kg (203 lb)            Medical Decision Making  Hypotension and diffuse tenderness.  Patient otherwise stable    Amount and/or Complexity of Data Reviewed  Labs: ordered.  Radiology: ordered.  ECG/medicine tests: ordered.    Risk  Prescription drug management.            REASSESSMENT     ED Course as of 01/25/23 0709   Tue Jan 25, 2023   0249 EKG at 2:30 AM read at 2:45 AM shows sinus tachycardia 118 bpm with no ST elevation or ectopy. [VM]   0709 Hypertension continued in the ER and clonidine helping the pressure down.  Urinalysis showed no evidence of UTI and patient was suitable for discharged home. [VM]      ED Course User Index  [VM] Cashton Hosley, Orie Rout, MD           CONSULTS:  None    PROCEDURES:  Unless otherwise noted below, none     Procedures      FINAL IMPRESSION      1. Essential hypertension    2. Paresthesias    3. Dysuria          DISPOSITION/PLAN   DISPOSITION Decision To Discharge 01/25/2023 07:07:59 AM      PATIENT REFERRED TO:  [facility] EMERGENCY [place]  [phone]    As needed, If symptoms worsen      DISCHARGE MEDICATIONS:  New Prescriptions    No medications on file         (Please note that portions of this note were completed with a voice recognition program.  Efforts were made to edit the dictations but occasionally words are mis-transcribed.)    Tyrone Sage, MD (electronically signed)  Emergency Attending Physician / Physician Assistant / Nurse Practitioner             Etheleen Nicks, MD  01/25/23 [phone]

## 2023-01-25 NOTE — ED Notes (Signed)
Patient discharged by [facility], MD  - pt sent to the front lobby, with strong and steady gait, no acute distress noted at time of discharge - Discharge information / home RX / and reasons to return to the ED were reviewed by the ED provider.

## 2023-01-25 NOTE — ED Triage Notes (Addendum)
Pt ambulatory to triage co whole body tingling x tonight. States it woke her up from her sleep. Pt also concerned that her BP is high. Pt endorses some numbness bilaterally on head. Pt took amlodipine 30 minutes pta     Pt takes amlodipine and propanolol for pressure.     Denies headache    NIH 0

## 2023-01-26 ENCOUNTER — Inpatient Hospital Stay
Admit: 2023-01-26 | Discharge: 2023-01-26 | Disposition: A | Discharge status: Home / Self Care | Payer: MEDICARE | Attending: Emergency Medicine

## 2023-01-26 ENCOUNTER — Emergency Department: Admit: 2023-01-26 | Payer: MEDICARE

## 2023-01-26 DIAGNOSIS — R202 Paresthesia of skin: Secondary | ICD-10-CM

## 2023-01-26 MED ORDER — NAPROXEN 500 MG PO TABS
500 | ORAL_TABLET | Freq: Two times a day (BID) | ORAL | 0 refills | Status: AC
Start: 2023-01-26 — End: 2023-02-09

## 2023-01-26 MED ORDER — METHOCARBAMOL 500 MG PO TABS
500 | ORAL_TABLET | Freq: Three times a day (TID) | ORAL | 0 refills | Status: AC | PRN
Start: 2023-01-26 — End: 2023-02-05

## 2023-01-26 MED ORDER — ALBUMIN HUMAN 5 % IV SOLN
5 | INTRAVENOUS | Status: DC
Start: 2023-01-26 — End: 2023-01-26

## 2023-01-26 MED ORDER — METHOCARBAMOL 500 MG PO TABS
500 | ORAL_TABLET | Freq: Three times a day (TID) | ORAL | 0 refills | Status: DC | PRN
Start: 2023-01-26 — End: 2023-01-26

## 2023-01-26 MED ORDER — ACETAMINOPHEN 500 MG PO TABS
500 | ORAL | Status: AC
Start: 2023-01-26 — End: 2023-01-26
  Administered 2023-01-26: 20:00:00 1000 mg via ORAL

## 2023-01-26 MED FILL — ALBUTEIN 5 % IV SOLN: 5 % | INTRAVENOUS | Qty: 250

## 2023-01-26 MED FILL — ACETAMINOPHEN EXTRA STRENGTH 500 MG PO TABS: 500 MG | ORAL | Qty: 2

## 2023-01-26 NOTE — ED Triage Notes (Signed)
Pt here yesterday for high BP, pt c/o bilateral tingling all over x 2 days, denies headache, denies dizziness , had CT , EKG , labs and urine , pt also having lower back pain

## 2023-01-26 NOTE — ED Provider Notes (Signed)
[facility] EMERGENCY DEP  EMERGENCY DEPARTMENT ENCOUNTER      Pt Name: Melanie Hudson  MRN: [id number]  [place] 1956-01-24  Date of evaluation: 01/26/2023  Provider: Quinn Axe, MD    CHIEF COMPLAINT       Chief Complaint   Patient presents with    Tingling         HISTORY OF PRESENT ILLNESS   (Location/Symptom, Timing/Onset, Context/Setting, Quality, Duration, Modifying Factors, Severity)  Note limiting factors.   Melanie Hudson is a 67 y.o. female who presents to the emergency department      The history is provided by the patient. No language interpreter was used.   Back Pain  Location:  Lumbar spine  Quality:  Cramping  Radiates to:  Does not radiate  Pain severity:  Moderate  Onset quality:  Gradual  Timing:  Constant  Progression:  Unchanged  Chronicity:  Chronic  Ineffective treatments:  None tried  Associated symptoms: numbness, paresthesias and tingling    Associated symptoms: no abdominal pain, no bladder incontinence, no bowel incontinence, no chest pain, no dysuria, no fever, no headaches, no leg pain, no pelvic pain, no perianal numbness and no weakness        Nursing Notes were reviewed.    REVIEW OF SYSTEMS    (2-9 systems for level 4, 10 or more for level 5)     Review of Systems   Constitutional:  Negative for activity change, chills and fever.   HENT:  Negative for nosebleeds.    Eyes:  Negative for visual disturbance.   Respiratory:  Negative for shortness of breath.    Cardiovascular:  Negative for chest pain and palpitations.   Gastrointestinal:  Negative for abdominal pain, bowel incontinence, constipation, diarrhea, nausea and vomiting.   Genitourinary:  Negative for bladder incontinence, difficulty urinating, dysuria, hematuria, pelvic pain and urgency.   Musculoskeletal:  Positive for back pain. Negative for neck pain and neck stiffness.   Skin:  Negative for color change.   Allergic/Immunologic: Negative for immunocompromised state.   Neurological:  Positive for tingling, numbness  and paresthesias. Negative for dizziness, seizures, syncope, weakness, light-headedness and headaches.   Psychiatric/Behavioral:  Negative for behavioral problems, confusion, hallucinations, self-injury and suicidal ideas.        Except as noted above the remainder of the review of systems was reviewed and negative.       PAST MEDICAL HISTORY   No past medical history on file.      SURGICAL HISTORY     No past surgical history on file.      CURRENT MEDICATIONS       Previous Medications    No medications on file       ALLERGIES     Patient has no known allergies.    FAMILY HISTORY     No family history on file.       SOCIAL HISTORY       Social History     Socioeconomic History    Marital status: Married       SCREENINGS         Glasgow Coma Scale  Eye Opening: Spontaneous  Best Verbal Response: Oriented  Best Motor Response: Obeys commands  Glasgow Coma Scale Score: 15                     CIWA Assessment  BP: (!) 177/92  Pulse: 99  PHYSICAL EXAM    (up to 7 for level 4, 8 or more for level 5)     ED Triage Vitals [01/26/23 1012]   BP Temp Temp Source Pulse Respirations SpO2 Height Weight - Scale   (!) 177/92 98.9 F (37.2 C) Oral 99 16 95 % -- 92.5 kg (203 lb 14.8 oz)       Physical Exam  Vitals and nursing note reviewed.   Constitutional:       General: She is not in acute distress.     Appearance: Normal appearance. She is not ill-appearing, toxic-appearing or diaphoretic.   HENT:      Head: Normocephalic and atraumatic.      Nose: Nose normal.      Mouth/Throat:      Mouth: Mucous membranes are moist.   Eyes:      Extraocular Movements: Extraocular movements intact.      Conjunctiva/sclera: Conjunctivae normal.      Pupils: Pupils are equal, round, and reactive to light.   Cardiovascular:      Comments: Warm and well perfused  Pulmonary:      Effort: Pulmonary effort is normal.   Musculoskeletal:         General: Normal range of motion.      Cervical back: Normal range of motion.      Lumbar  back: Tenderness present. No bony tenderness.   Skin:     General: Skin is warm and dry.   Neurological:      General: No focal deficit present.      Mental Status: She is alert and oriented to person, place, and time.   Psychiatric:         Mood and Affect: Mood normal.         Behavior: Behavior normal.         Thought Content: Thought content normal.         Judgment: Judgment normal.         DIAGNOSTIC RESULTS     EKG: All EKG's are interpreted by the Emergency Department Physician who either signs or Co-signs this chart in the absence of a cardiologist.        RADIOLOGY:   Non-plain film images such as CT, Ultrasound and MRI are read by the radiologist. Plain radiographic images are visualized and preliminarily interpreted by the emergency physician with the below findings:        Interpretation per the Radiologist below, if available at the time of this note:    XR LUMBAR SPINE (MIN 4 VIEWS)    (Results Pending)         ED BEDSIDE ULTRASOUND:   Performed by ED Physician - none    LABS:  Labs Reviewed - No data to display    All other labs were within normal range or not returned as of this dictation.    EMERGENCY DEPARTMENT COURSE and DIFFERENTIAL DIAGNOSIS/MDM:   Vitals:    Vitals:    01/26/23 1012   BP: (!) 177/92   Pulse: 99   Resp: 16   Temp: 98.9 F (37.2 C)   TempSrc: Oral   SpO2: 95%   Weight: 92.5 kg (203 lb 14.8 oz)           Medical Decision Making  Amount and/or Complexity of Data Reviewed  Radiology: ordered.    Risk  OTC drugs.  Prescription drug management.    This is a 67 year old female with past medical history, review of systems, physical  exam as above, presenting with complaints of low back pain, paresthesias.  Patient states she presented here yesterday for the same symptoms, was noted to be hypertensive and out of concern for associated paresthesias, underwent a workup for hypertensive urgency versus emergency.  Workup unremarkable, blood pressure improved, patient returns today saying  her right lower back pain was not evaluated and she continues to have paresthesias.  On physical exam she is noted to have right lumbar paraspinal tenderness over the large muscle groups, without midline tenderness, denies saddle anesthesia, bowel or bladder incontinence.  She is again hypertensive today, stating that paresthesias are nonfocal, "all over", without associated weakness.  Discussed with patient reassuring workup yesterday, plan to obtain plain films of the L-spine, provide Tylenol.  We will reassess, and make a disposition.        REASSESSMENT            CONSULTS:  None    PROCEDURES:  Unless otherwise noted below, none     Procedures        FINAL IMPRESSION    No diagnosis found.      DISPOSITION/PLAN   DISPOSITION        PATIENT REFERRED TO:  No follow-up provider specified.    DISCHARGE MEDICATIONS:  New Prescriptions    No medications on file     Controlled Substances Monitoring:          No data to display                (Please note that portions of this note were completed with a voice recognition program.  Efforts were made to edit the dictations but occasionally words are mis-transcribed.)    Quinn Axe, MD (electronically signed)  Attending Emergency Physician           Degan Hanser, Sol Blazing, MD  01/26/23 2050

## 2023-02-14 ENCOUNTER — Ambulatory Visit: Admit: 2023-02-14 | Payer: MEDICARE | Primary: Internal Medicine

## 2023-02-14 DIAGNOSIS — M545 Low back pain, unspecified: Secondary | ICD-10-CM

## 2023-02-14 NOTE — Progress Notes (Signed)
[facility]  [facility]  [address] [place], [place]  [place]  Phone [phone] Fax [phone]  Test Date:  02/14/2023    Patient: Melanie Hudson DOB: [birth year] Physician: Thana Ates, MD   Sex: Female Height: ' " Ref Phys: Rudolpho Sevin, MD   ID#: [id number] Weight: 203 lbs. Technician: Tye Savoy     Patient Complaints:      Patient History / Exam:  67 year old female who is being evaluated for low back pain that sometimes radiates to the right thigh      NCV & EMG Findings:  All nerve conduction studies were within normal limits.      All F Wave latencies were within normal limits.      Needle evaluation of the right peroneus longus muscle showed increased insertional activity and increased motor unit amplitude.  The right low lumbosacral paraspinal muscle showed increased insertional activity.  All remaining muscles (as indicated in the following table) showed no evidence of electrical instability.      Impression:    The electrodiagnostic testing is unremarkable except for mild acute and chronic changes seen in the L5 myotome on the right, which may represent a mild chronic radiculopathy at this level.  No evidence to suggest an entrapment mononeuropathy or peripheral neuropathy.    Recommendations:  Trial of PT for lower back.  If symptoms persist, consider MRI of the lumbar spine    ___________________________  Thana Ates, MD        Nerve Conduction Studies  Anti Sensory Summary Table     Stim Site NR Peak (ms) Norm Peak (ms) P-T Amp (V) Norm P-T Amp Onset (ms) Site1 Site2 Delta-P (ms) Dist (cm) Vel (m/s) Norm Vel (m/s)   Right Sup Peroneal Anti Sensory (Ant Lat Mall)  31.4 C   14 cm    2.3 <4.4 24.3 >5.0 1.8 14 cm Ant Lat Mall 2.3 14.0 61 >32   Right Sural Anti Sensory (Lat Mall)  31 C   Calf    3.4 <4.0 20.5 >5.0 2.8 Calf Lat Mall 3.4 14.0 41 >35     Motor Summary Table     Stim Site NR Onset (ms) Norm Onset (ms) O-P  Amp (mV) Norm O-P Amp Site1 Site2 Delta-0 (ms) Dist (cm) Vel (m/s) Norm Vel (m/s)   Right Peroneal Motor (Ext Dig Brev)  31.8 C   Ankle    4.5 <6.1 2.6 >2.5 B Fib Ankle 7.4 38.0 51 >38   B Fib    11.9  1.6  Poplt B Fib 1.8 10.0 56 >40   Poplt    13.7  1.6          Right Tibial Motor (Abd Hall Brev)  32.2 C   Ankle    4.1 <6.1 3.4 >3.0 Knee Ankle 10.3 43.0 42 >35   Knee    14.4  1.9            F Wave Studies     NR F-Lat (ms) Lat Norm (ms) L-R F-Lat (ms) L-R Lat Norm   Right Tibial (Mrkrs) (Abd Hallucis)  32.4 C      53.68 <61  <5.7     EMG     Side Muscle Nerve Root Ins Act Fibs Psw Amp Dur Poly Recrt Int Pat Comment   Right AntTibialis Dp Br Peronel L4-5 Nml Nml Nml Nml Nml 0 Nml Nml    Right Peroneus Long Sup  Br Peronel L5-S1 Incr Nml Nml Incr Nml 0 Nml Nml    Right Gastroc Tibial S1-2 Nml Nml Nml Nml Nml 0 Nml Nml    Right Flex Dig Long Tibial L5-S2 Nml Nml Nml Nml Nml 0 Nml Nml    Right VastusLat Femoral L2-4 Nml Nml Nml Nml Nml 0 Nml Nml    Right Lumbo Parasp Low Rami L5-S1 Incr Nml Nml               Waveforms:

## 2023-03-24 ENCOUNTER — Ambulatory Visit: Payer: MEDICARE | Attending: Women's Health | Primary: Women's Health

## 2023-05-26 ENCOUNTER — Inpatient Hospital Stay: Admit: 2023-05-26 | Payer: MEDICARE | Primary: Women's Health

## 2023-05-26 ENCOUNTER — Encounter

## 2023-05-26 DIAGNOSIS — Z1231 Encounter for screening mammogram for malignant neoplasm of breast: Secondary | ICD-10-CM

## 2024-05-09 ENCOUNTER — Encounter

## 2024-05-28 ENCOUNTER — Inpatient Hospital Stay: Admit: 2024-05-28 | Payer: Medicare (Managed Care) | Primary: Women's Health

## 2024-05-28 VITALS — Ht 66.5 in | Wt 207.0 lb

## 2024-05-28 DIAGNOSIS — Z1231 Encounter for screening mammogram for malignant neoplasm of breast: Secondary | ICD-10-CM
